# Patient Record
Sex: Female | Born: 1975 | Race: White | Hispanic: No | Marital: Married | Smoking: Never smoker
Health system: Southern US, Community
[De-identification: ages and names within clinical notes are randomized; demographics above are authoritative.]

## PROBLEM LIST (undated history)

## (undated) HISTORY — PX: ABDOMINAL HYSTERECTOMY: SHX81

## (undated) HISTORY — PX: TONSILLECTOMY: SUR1361

---

## 2009-06-26 ENCOUNTER — Ambulatory Visit: Payer: Self-pay | Admitting: Family Medicine

## 2011-08-28 ENCOUNTER — Ambulatory Visit: Payer: Self-pay | Admitting: Internal Medicine

## 2013-04-20 ENCOUNTER — Emergency Department: Payer: Self-pay | Admitting: Emergency Medicine

## 2014-12-05 ENCOUNTER — Other Ambulatory Visit: Payer: Self-pay | Admitting: Family Medicine

## 2014-12-05 DIAGNOSIS — Z1231 Encounter for screening mammogram for malignant neoplasm of breast: Secondary | ICD-10-CM

## 2014-12-11 ENCOUNTER — Ambulatory Visit: Payer: Self-pay | Attending: Family Medicine

## 2015-02-06 ENCOUNTER — Ambulatory Visit
Admission: RE | Admit: 2015-02-06 | Discharge: 2015-02-06 | Disposition: A | Discharge status: Home / Self Care | Payer: 59 | Source: Ambulatory Visit | Attending: Family Medicine | Admitting: Family Medicine

## 2015-02-06 DIAGNOSIS — Z1231 Encounter for screening mammogram for malignant neoplasm of breast: Secondary | ICD-10-CM | POA: Insufficient documentation

## 2015-06-03 ENCOUNTER — Ambulatory Visit (INDEPENDENT_AMBULATORY_CARE_PROVIDER_SITE_OTHER): Payer: 59

## 2015-06-03 ENCOUNTER — Telehealth: Payer: Self-pay | Admitting: *Deleted

## 2015-06-03 ENCOUNTER — Ambulatory Visit: Payer: 59

## 2015-06-03 ENCOUNTER — Ambulatory Visit (INDEPENDENT_AMBULATORY_CARE_PROVIDER_SITE_OTHER): Payer: 59 | Admitting: Podiatry

## 2015-06-03 VITALS — BP 118/77 | HR 85 | Resp 18

## 2015-06-03 DIAGNOSIS — R52 Pain, unspecified: Secondary | ICD-10-CM

## 2015-06-03 DIAGNOSIS — T148 Other injury of unspecified body region: Secondary | ICD-10-CM | POA: Diagnosis not present

## 2015-06-03 DIAGNOSIS — IMO0002 Reserved for concepts with insufficient information to code with codable children: Secondary | ICD-10-CM

## 2015-06-03 MED ORDER — DICLOFENAC SODIUM 1 % TD GEL: 2.0000 g | Freq: Four times a day (QID) | TRANSDERMAL | Status: DC

## 2015-06-03 MED ORDER — MELOXICAM 15 MG PO TABS: 15.0000 mg | ORAL_TABLET | Freq: Every day | ORAL | Status: DC

## 2015-06-03 NOTE — Telephone Encounter (Signed)
Pt states seen in [facility] today, Dr. Ardelle Anton stated she should use surgical shoe with a pad, but pt stated could not wear at work.  Pt states she asked her supervisor and she said she could wear the surgical shoe.  Pt to go to [facility] office to get surgical shoe with offloading pad placement either by Dr. Ardelle Anton or Misty Stanley.

## 2015-06-03 NOTE — Progress Notes (Signed)
Subjective:     Patient ID: Tricia Whitaker, female   DOB: [birth year], 40 y.o.   MRN: [id number]  HPI 40 year old female presents to the office today for concerns of right pain which has been going on for the last "couple of months". The pain on the right foot started as she started working out and doing the ellipitical and treadmill. She stopped and the pain got better for some time. She changed shoes and went back to the gym and the pain started again. She describes it as a sharp pain. The pain has started more recently on the left. She has tried a foot bath with epsom salts which helps quite a bit. The pain is continuous and worsens with walking. No injury or trauma. No other complaints today.   Review of Systems  All other systems reviewed and are negative.      Objective:   Physical Exam General: AAO x3, NAD  Dermatological: Skin is warm, dry and supple bilateral. Nails x 10 are well manicured; remaining integument appears unremarkable at this time. There are no open sores, no preulcerative lesions, no rash or signs of infection present.  Vascular: Dorsalis Pedis artery and Posterior Tibial artery pedal pulses are 2/4 bilateral with immedate capillary fill time. Pedal hair growth present. No varicosities and no lower extremity edema present bilateral. There is no pain with calf compression, swelling, warmth, erythema.   Neruologic: Grossly intact via light touch bilateral. Vibratory intact via tuning fork bilateral. Protective threshold with Semmes Wienstein monofilament intact to all pedal sites bilateral. Patellar and Achilles deep tendon reflexes 2+ bilateral. No Babinski or clonus noted bilateral.   Musculoskeletal: Tenderness to palpation along the medial sesamoid bilaterally. There is localized edema overlying the plantar aspect of the first MTPJ on the right foot. There is no erythema or increase in warmth. There is no other areas of tenderness to bilateral lower x-rays.  There is no pain with first MTPJ range of motion.  Gait: Unassisted, Nonantalgic.      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  -Recommended surgical shoe and offloading for the right foot however she cannot wear this at work. I made offloading pads applied and the inserts in her shoes. I discussed steroid injection to the right side as this is likely chronic at this point have her she wishes to hold off. -Prescribed mobic. Discussed side effects of the medication and directed to stop if any are to occur and call the office.  -Voltaren gel -Follow-up in 3 weeks or sooner if any problems arise. In the meantime, encouraged to call the office with any questions, concerns, change in symptoms.  *x-ray next appointment  Ovid Curd, DPM

## 2015-06-04 NOTE — Telephone Encounter (Signed)
i will dispense the surgical shoe when patient stops by. Misty Stanley

## 2015-06-17 NOTE — Telephone Encounter (Signed)
Patient came by and got a darco shoe.Tricia Whitaker

## 2015-06-24 ENCOUNTER — Ambulatory Visit: Payer: 59 | Admitting: Podiatry

## 2015-06-26 ENCOUNTER — Ambulatory Visit (INDEPENDENT_AMBULATORY_CARE_PROVIDER_SITE_OTHER): Payer: 59 | Admitting: Podiatry

## 2015-06-26 ENCOUNTER — Encounter: Payer: Self-pay | Admitting: Podiatry

## 2015-06-26 ENCOUNTER — Ambulatory Visit: Payer: 59

## 2015-06-26 ENCOUNTER — Ambulatory Visit (INDEPENDENT_AMBULATORY_CARE_PROVIDER_SITE_OTHER): Payer: 59

## 2015-06-26 VITALS — BP 98/64 | HR 89 | Resp 18

## 2015-06-26 DIAGNOSIS — R52 Pain, unspecified: Secondary | ICD-10-CM

## 2015-06-26 DIAGNOSIS — M779 Enthesopathy, unspecified: Secondary | ICD-10-CM

## 2015-06-26 DIAGNOSIS — T148 Other injury of unspecified body region: Secondary | ICD-10-CM

## 2015-06-26 DIAGNOSIS — IMO0002 Reserved for concepts with insufficient information to code with codable children: Secondary | ICD-10-CM

## 2015-06-26 MED ORDER — METHYLPREDNISOLONE 4 MG PO TBPK: ORAL_TABLET | ORAL | Status: DC

## 2015-06-27 NOTE — Progress Notes (Signed)
Patient ID: Tricia Whitaker, female   DOB: 08/03/1975, 40 y.o.   MRN: [id number]  Subjective: 40 year old female presents the office today for follow up evaluation of pain to both of her feet along the sesamoids. She states they feel that this and if it is less time. Anti-inflammatories do not help. The offloading pads, metatarsal pad, did not help much. No other complaints. Denies any systemic complaints such as fevers, chills, nausea, vomiting. No acute changes since last appointment, and no other complaints at this time.   Objective: AAO x3, NAD DP/PT pulses palpable bilaterally, CRT less than 3 seconds Protective sensation intact with Simms Weinstein monofilament There is continued tenderness palpation of the medial sesamoid bilaterally. There is slight edema along the sesamoid on the right side however there is no swelling or redness of the left side. There is no pain with MPJ range of motion. No other areas of tenderness. There doesn't be to be a slowly plantarflexed first metatarsal. No areas of pinpoint bony tenderness or pain with vibratory sensation. MMT 5/5, ROM WNL. No edema, erythema, increase in warmth to bilateral lower extremities.  No open lesions or pre-ulcerative lesions.  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  Plan: -All treatment options discussed with the patient including all alternatives, risks, complications.  -Given her continued pain discussed Medrol Dosepak. He is been ongoing for several months with continued pain. I doubly this is an acute fracture. She would proceed with steroid is ordered today.  -She likely benefit more from orthotics help support her foot and take pressure off the first metatarsal phalangeal joint and the sesamoids. Should proceed with orthotics today. [facility] -Recommended an  injection but she wishes to hold off. -Follow-up in 3 weeks to pick up orthotics or sooner if any issues are to arise.  -Patient encouraged to call the office with any questions, concerns, change in symptoms.   Ovid CurdMatthew Wagoner, DPM

## 2015-07-17 ENCOUNTER — Ambulatory Visit (INDEPENDENT_AMBULATORY_CARE_PROVIDER_SITE_OTHER): Payer: 59 | Admitting: Podiatry

## 2015-07-17 ENCOUNTER — Encounter: Payer: Self-pay | Admitting: Podiatry

## 2015-07-17 VITALS — BP 94/67 | HR 93 | Resp 18

## 2015-07-17 DIAGNOSIS — M779 Enthesopathy, unspecified: Secondary | ICD-10-CM | POA: Diagnosis not present

## 2015-07-17 DIAGNOSIS — M2580 Other specified joint disorders, unspecified joint: Secondary | ICD-10-CM | POA: Diagnosis not present

## 2015-07-17 NOTE — Progress Notes (Signed)
Patient ID: Tricia Whitaker, female   DOB: 11/16/1975, 40 y.o.   MRN: [id number]  Subjective: 40 year old female presents the office today for follow up evaluation of pain to both of her feet along the sesamoids. She states that since doing the steroid she's had significant improvement in symptoms. She had some occasional discomfort but she is doing better. She presents to pick up orthotics. No recent swelling or redness. Denies any systemic complaints such as fevers, chills, nausea, vomiting. No acute changes since last appointment, and no other complaints at this time.   Objective: AAO x3, NAD DP/PT pulses palpable bilaterally, CRT less than 3 seconds This greatly improved tenderness palpation along the course of the medial sesamoids bilaterally. There is some smile discomfort on the right side however the patient left side has resolved. The swelling on the right side is also decreased. There is no erythema. There is no swelling or redness of the left side. No other areas of tenderness. There is no pain with first MTPJ range of motion. No areas of pinpoint bony tenderness or pain with vibratory sensation. MMT 5/5, ROM WNL. No edema, erythema, increase in warmth to bilateral lower extremities.  No open lesions or pre-ulcerative lesions.  No pain with calf compression, swelling, warmth, erythema  Assessment: Resolving sesamoiditis.  Plan: -All treatment options discussed with the patient including all alternatives, risks, complications.  -At this injection but she wishes to hold off. Orthotics were tried on today however the cut out the sesamoids stops right at with the sesamoids or at impression the sesamoids so. I will send orthotics back to have them modified. Continue with supportive shoe gear. Anti-inflammatories as needed. We will call her once the orthotics arrive. Encouraged to call sooner any problems or any questions or concerns.  Ovid CurdMatthew Lola Lofaro, DPM

## 2015-08-13 ENCOUNTER — Telehealth: Payer: Self-pay | Admitting: *Deleted

## 2015-08-13 NOTE — Telephone Encounter (Signed)
CALLED PATIENT AND LEFT MESSAGE FOR THE PATIENT TO COME AND PICK UP INSERTS (REFURBISHED) IN THE [place] OFFICE. Tricia Whitaker

## 2015-12-13 ENCOUNTER — Ambulatory Visit (INDEPENDENT_AMBULATORY_CARE_PROVIDER_SITE_OTHER)
Admission: EM | Admit: 2015-12-13 | Discharge: 2015-12-13 | Disposition: A | Discharge status: Other Acute Inpatient Hospital | Payer: 59 | Source: Home / Self Care | Attending: Family Medicine | Admitting: Family Medicine

## 2015-12-13 ENCOUNTER — Emergency Department
Admission: EM | Admit: 2015-12-13 | Discharge: 2015-12-13 | Disposition: A | Discharge status: Home / Self Care | Payer: 59 | Attending: Emergency Medicine | Admitting: Emergency Medicine

## 2015-12-13 ENCOUNTER — Emergency Department: Payer: 59

## 2015-12-13 ENCOUNTER — Encounter: Payer: Self-pay | Admitting: *Deleted

## 2015-12-13 ENCOUNTER — Encounter: Payer: Self-pay | Admitting: Emergency Medicine

## 2015-12-13 DIAGNOSIS — R1084 Generalized abdominal pain: Secondary | ICD-10-CM | POA: Diagnosis not present

## 2015-12-13 DIAGNOSIS — R51 Headache: Secondary | ICD-10-CM | POA: Diagnosis not present

## 2015-12-13 DIAGNOSIS — R1031 Right lower quadrant pain: Secondary | ICD-10-CM | POA: Insufficient documentation

## 2015-12-13 DIAGNOSIS — Z79899 Other long term (current) drug therapy: Secondary | ICD-10-CM | POA: Insufficient documentation

## 2015-12-13 DIAGNOSIS — R519 Headache, unspecified: Secondary | ICD-10-CM

## 2015-12-13 DIAGNOSIS — Z791 Long term (current) use of non-steroidal anti-inflammatories (NSAID): Secondary | ICD-10-CM | POA: Diagnosis not present

## 2015-12-13 LAB — COMPREHENSIVE METABOLIC PANEL WITH GFR
ALT: 24 U/L (ref 14–54)
AST: 24 U/L (ref 15–41)
Albumin: 4.3 g/dL (ref 3.5–5.0)
Alkaline Phosphatase: 37 U/L — ABNORMAL LOW (ref 38–126)
Anion gap: 8 (ref 5–15)
BUN: 13 mg/dL (ref 6–20)
CO2: 24 mmol/L (ref 22–32)
Calcium: 8.7 mg/dL — ABNORMAL LOW (ref 8.9–10.3)
Chloride: 104 mmol/L (ref 101–111)
Creatinine, Ser: 0.65 mg/dL (ref 0.44–1.00)
GFR calc Af Amer: >60 mL/min (ref >60)
GFR calc non Af Amer: >60 mL/min (ref >60)
Glucose, Bld: 99 mg/dL (ref 65–99)
Potassium: 3.5 mmol/L (ref 3.5–5.1)
Sodium: 136 mmol/L (ref 135–145)
Total Bilirubin: 0.6 mg/dL (ref 0.3–1.2)
Total Protein: 7.4 g/dL (ref 6.5–8.1)

## 2015-12-13 LAB — CBC
HCT: 45.1 % (ref 35.0–47.0)
Hemoglobin: 16 g/dL (ref 12.0–16.0)
MCH: 30.2 pg (ref 26.0–34.0)
MCHC: 35.4 g/dL (ref 32.0–36.0)
MCV: 85.4 fL (ref 80.0–100.0)
Platelets: 262 K/uL (ref 150–440)
RBC: 5.29 MIL/uL — ABNORMAL HIGH (ref 3.80–5.20)
RDW: 12.7 % (ref 11.5–14.5)
WBC: 8.1 K/uL (ref 3.6–11.0)

## 2015-12-13 LAB — URINALYSIS COMPLETE WITH MICROSCOPIC (ARMC ONLY)
Bilirubin Urine: NEGATIVE
Glucose, UA: NEGATIVE mg/dL
Ketones, ur: NEGATIVE mg/dL
LEUKOCYTES UA: NEGATIVE
Nitrite: NEGATIVE
PH: 5 (ref 5.0–8.0)
Protein, ur: NEGATIVE mg/dL
Specific Gravity, Urine: 1.012 (ref 1.005–1.030)

## 2015-12-13 LAB — LIPASE, BLOOD: Lipase: 23 U/L (ref 11–51)

## 2015-12-13 MED ORDER — IOPAMIDOL (ISOVUE-300) INJECTION 61%
100.0000 mL | Freq: Once | INTRAVENOUS | Status: AC | PRN
  Administered 2015-12-13: 100 mL via INTRAVENOUS

## 2015-12-13 MED ORDER — MORPHINE SULFATE (PF) 2 MG/ML IV SOLN
2.0000 mg | Freq: Once | INTRAVENOUS | Status: AC
  Administered 2015-12-13: 2 mg via INTRAVENOUS
  Filled 2015-12-13: qty 1

## 2015-12-13 MED ORDER — MORPHINE SULFATE (PF) 4 MG/ML IV SOLN
4.0000 mg | Freq: Once | INTRAVENOUS | Status: AC
  Administered 2015-12-13: 4 mg via INTRAVENOUS
  Filled 2015-12-13: qty 1

## 2015-12-13 MED ORDER — SODIUM CHLORIDE 0.9 % IV BOLUS (SEPSIS)
1000.0000 mL | Freq: Once | INTRAVENOUS | Status: AC
  Administered 2015-12-13: 1000 mL via INTRAVENOUS

## 2015-12-13 MED ORDER — METOCLOPRAMIDE HCL 5 MG/ML IJ SOLN
10.0000 mg | Freq: Once | INTRAMUSCULAR | Status: AC
  Administered 2015-12-13: 10 mg via INTRAVENOUS
  Filled 2015-12-13: qty 2

## 2015-12-13 MED ORDER — IOPAMIDOL (ISOVUE-300) INJECTION 61%
30.0000 mL | Freq: Once | INTRAVENOUS | Status: AC | PRN
  Administered 2015-12-13: 30 mL via ORAL

## 2015-12-13 NOTE — ED Provider Notes (Addendum)
[facility] Emergency Department Provider Note   ____________________________________________   First MD Initiated Contact with Patient 12/13/15 1133     (approximate)  I have reviewed the triage vital signs and the nursing notes.   HISTORY  Chief Complaint Abdominal Pain    HPI Tricia Whitaker is a 40 y.o. female reports she is been having abdominal pain for the last 2 days, started became fairly severe after eating yesterday. She reports an ongoing pain centered over the right mid to right lower abdomen, but occasionally feels "all over". She had some mild chills yesterday.  No vomiting, no nausea. No diarrhea. No black or bloody stool.  Patient reports that she's also been having an ongoing headache which is been going on for the last 6 weeks, seen and evaluated by neurology who started her on steroid medication recently. She had been taking BC powder daily prior to that, but stopped taking this 2 days ago.  Denies previous abdominal surgery except for hysterectomy.   History reviewed. No pertinent past medical history.  Patient Active Problem List   Diagnosis Date Noted  . 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 ABDOMINAL HYSTERECTOMY    . TONSILLECTOMY      Prior to Admission medications   Medication Sig Start Date End Date Taking? Authorizing Provider  clonazePAM (KLONOPIN) 0.5 MG tablet TAKE 1 TABLET BY MOUTH 2 TIMES A DAY AS NEEDED 03/17/15   Historical Provider, MD  diclofenac sodium (VOLTAREN) 1 % GEL Apply 2 g topically 4 (four) times daily. Rub into affected area of foot 2 to 4 times daily 06/03/15   Vivi BarrackMatthew R Wagoner, DPM  escitalopram (LEXAPRO) 20 MG tablet Take by mouth. 03/13/15 06/11/15  Historical Provider, MD  meloxicam (MOBIC) 15 MG tablet Take 1 tablet (15 mg total) by mouth daily. 06/03/15   Vivi BarrackMatthew R Wagoner, DPM  methylPREDNISolone (MEDROL DOSEPAK) 4 MG TBPK tablet Take as  directed 06/26/15   Vivi BarrackMatthew R Wagoner, DPM  phentermine (ADIPEX-P) 37.5 MG tablet Take 37.5 mg by mouth every morning. 03/17/15   Historical Provider, MD  Vitamin D, Ergocalciferol, (DRISDOL) 50000 units CAPS capsule  03/21/14   Historical Provider, MD    Allergies Nicoderm [nicotine]  Family History  Problem Relation Age of Onset  . Breast cancer Mother     Social History Social History  Substance Use Topics  . Smoking status: Never Smoker  . Smokeless tobacco: Never Used  . Alcohol use 0.0 oz/week     Comment: not often    Review of Systems Constitutional: No fever/chills Eyes: No visual changes. ENT: No sore throat.No stiff or sore neck. Cardiovascular: Denies chest pain. Respiratory: Denies shortness of breath. Gastrointestinal:  No nausea, no vomiting.  No diarrhea.  No constipation. Genitourinary: Negative for dysuria. Musculoskeletal: Negative for back pain. Skin: Negative for rash. Neurological: Negative for focal weakness or numbness.Reports a pounding bifrontal headache off and on that almost every day for the last 6 weeks.  10-point ROS otherwise negative.  ____________________________________________   PHYSICAL EXAM:  VITAL SIGNS: ED Triage Vitals  Enc Vitals Group     BP 12/13/15 1131 (!) 119/104     Pulse Rate 12/13/15 1131 92     Resp 12/13/15 1131 15     Temp 12/13/15 1131 98.3 F (36.8 C)     Temp Source 12/13/15 1131 Oral     SpO2 12/13/15 1131 98 %     Weight 12/13/15  1120 195 lb (88.5 kg)     Height 12/13/15 1120 5\' 3"  (1.6 m)     Head Circumference --      Peak Flow --      Pain Score 12/13/15 1120 7     Pain Loc --      Pain Edu? --      Excl. in GC? --     Constitutional: Alert and oriented. Well appearing and in no acute distress. Eyes: Conjunctivae are normal. PERRL. EOMI.No photophobia Head: Atraumatic. Nose: No congestion/rhinnorhea. No meningismus Mouth/Throat: Mucous membranes are moist.  Oropharynx non-erythematous. Neck:  No stridor.   Cardiovascular: Normal rate, regular rhythm. Grossly normal heart sounds.  Good peripheral circulation. Respiratory: Normal respiratory effort.  No retractions. Lungs CTAB. Gastrointestinal: Soft and nontender except for moderate discomfort in the right mid to right lower abdomen. Negative Murphy. No rebound or guarding. No distention. No abdominal bruits. No CVA tenderness. Musculoskeletal: No lower extremity tenderness nor edema. Neurologic:  Normal speech and language. No gross focal neurologic deficits are appreciated. Skin:  Skin is warm, dry and intact. No rash noted. No pronator drift. Normal strength in all extremities with normal sensation. No photophobia. Normal extraocular movements. Normal cranial nerve exam Psychiatric: Mood and affect are normal. Speech and behavior are normal.  ____________________________________________   LABS (all labs ordered are listed, but only abnormal results are displayed)  Labs Reviewed  COMPREHENSIVE METABOLIC PANEL - Abnormal; Notable for the following:       Result Value   Calcium 8.7 (*)    Alkaline Phosphatase 37 (*)    All other components within normal limits  CBC - Abnormal; Notable for the following:    RBC 5.29 (*)    All other components within normal limits  URINALYSIS COMPLETEWITH MICROSCOPIC ([facility] ONLY) - Abnormal; Notable for the following:    Color, Urine YELLOW (*)    APPearance CLEAR (*)    Hgb urine dipstick 1+ (*)    Bacteria, UA RARE (*)    Squamous Epithelial / LPF 0-5 (*)    All other components within normal limits  LIPASE, BLOOD   ____________________________________________  EKG  ED ECG REPORT I, Crayton Savarese, the attending physician, personally viewed and interpreted this ECG.  Date: 12/13/2015 EKG Time: 11:30 Rate: 86 Rhythm: normal sinus rhythm QRS Axis: normal Intervals: normal ST/T Wave abnormalities: normal Conduction Disturbances: none Narrative Interpretation:  unremarkable  ____________________________________________  RADIOLOGY  Discussed the risks and benefits of CT of the head due to the patient's ongoing headaches, and in discussion she reports that she would not wish for a CT scan as she has a history of the same headaches off and on for a long time and she reports she believes she's had a previous CT or an MRI many years ago for the same reason. I think this is reasonable, her primary reason for ER evaluation today appears to be due to her abdominal pain and patient has had recent follow-up and continues to follow closely with neurology with no sign of acute neurologic abnormality or deficit.  Ct Abdomen Pelvis W Contrast  Result Date: 12/13/2015 CLINICAL DATA:  Right lower quadrant abdomen pain for 2 days. EXAM: CT ABDOMEN AND PELVIS WITH CONTRAST TECHNIQUE: Multidetector CT imaging of the abdomen and pelvis was performed using the standard protocol following bolus administration of intravenous contrast. CONTRAST:  ISOVUE-300 IOPAMIDOL (ISOVUE-300) INJECTION 61% COMPARISON:  None. FINDINGS: Lower chest:  There is mild atelectasis of bilateral lung bases. Hepatobiliary: The  liver and gallbladder are normal. No masses or other significant abnormality. Pancreas: No mass, inflammatory changes, or other significant abnormality. Spleen: Within normal limits in size and appearance. Adrenals/Urinary Tract: The adrenal glands and kidneys are normal. The bladder is normal. No masses identified. No evidence of hydronephrosis. Stomach/Bowel: No evidence of obstruction, inflammatory process, or abnormal fluid collections. The appendix is normal. Moderate bowel content is identified throughout colon. Vascular/Lymphatic: No pathologically enlarged lymph nodes. No evidence of abdominal aortic aneurysm. Reproductive: No mass or other significant abnormality. Small ovarian cysts are identified bilaterally in normal size bilateral ovaries. Patient status post prior  hysterectomy. Other: There is umbilical herniation of mesenteric fat. Musculoskeletal: Minimal degenerative joint changes of the spine are noted. IMPRESSION: No acute abnormality identified in the abdomen pelvis. The appendix is normal. Electronically Signed   By: Sherian Rein M.D.   On: 12/13/2015 13:54    ____________________________________________   PROCEDURES  Procedure(s) performed: None  Procedures  Critical Care performed: No  ____________________________________________   INITIAL IMPRESSION / ASSESSMENT AND PLAN / ED COURSE  Pertinent labs & imaging results that were available during my care of the patient were reviewed by me and considered in my medical decision making (see chart for details).  Differential diagnosis includes but is not limited to, abdominal perforation, aortic dissection, cholecystitis, appendicitis, diverticulitis, colitis, esophagitis/gastritis, kidney stone, pyelonephritis, urinary tract infection, aortic aneurysm. All are considered in decision and treatment plan. Based upon the patient's presentation and risk factors, we'll proceed with CT to evaluate for etiology such as possible appendicitis, cholecystitis. However, based on the patient's symptomatology and recent use of BC powder/NSAIDs would also consider the possibility of gastritis or ulcerative disease which CT may not evaluate well for.  ----------------------------------------- 2:11 PM on 12/13/2015 -----------------------------------------  Patient reports her headache has resolved. She feels much better. She did have a mild irritation and erythema at the site of morphine injection, but this is improved. I discussed with her and will place her on Zantac twice daily, and advised close follow-up with her primary care doctor and careful abdominal pain precautions with patient and her husband are agreeable with. Family will be driving her home.  Return precautions and treatment recommendations and  follow-up discussed with the patient who is agreeable with the plan.  Clinical Course     ____________________________________________   FINAL CLINICAL IMPRESSION(S) / ED DIAGNOSES  Final diagnoses:  Recurrent headache  Abdominal pain of unknown cause, right      NEW MEDICATIONS STARTED DURING THIS VISIT:  New Prescriptions   No medications on file     Note:  This document was prepared using Dragon voice recognition software and may include unintentional dictation errors.     Sharyn Creamer, MD 12/13/15 1413    Sharyn Creamer, MD 12/13/15 [phone]

## 2015-12-13 NOTE — ED Notes (Signed)
Returned from CT.

## 2015-12-13 NOTE — Discharge Instructions (Signed)
You were seen in the emergency room for abdominal pain. It is important that you follow up closely with your primary care doctor in the next couple of days.  I recommend starting Zantac (over-the-counter) for the next 2 weeks.  Please return to the emergency room right away if you are to develop a fever, severe nausea, your pain becomes severe or worsens, you are unable to keep food down, begin vomiting any dark or bloody fluid, you develop any dark or bloody stools, feel dehydrated, or other new concerns or symptoms arise.

## 2015-12-13 NOTE — ED Triage Notes (Signed)
[facility]  Patient is complaining of generalized abdominal pain since Thursday and a headache x 6 weeks.  Patient states pain is "gnawing, with occasional sharp pains".  Patient ambulatory to triage but appears somewhat uncomfortable.  Patient states abdomen is very tender.  Patient denies nausea, vomiting and diarrhea.

## 2015-12-13 NOTE — Discharge Instructions (Signed)
Go directly to emergency room as discussed.  °

## 2015-12-13 NOTE — ED Triage Notes (Signed)
Onset of headache, abd pain, chills, and body aches Thursday. Pt states chills resolved but headache and abd pain have persisted.

## 2015-12-13 NOTE — ED Provider Notes (Signed)
[facility] ____________________________________________  Time seen: Approximately 0950 AM  I have reviewed the triage vital signs and the nursing notes.   HISTORY  Chief Complaint Abdominal Pain; Migraine; Generalized Body Aches; and Chills   HPI Tricia Whitaker is a 40 y.o. female presents for multiple medical complaints. Patient reports her biggest reason, today is that she has had abdominal pain for the last 3 days. Patient reports she has a constant generalized abdominal pain with intermittent sharp stabbing pains in different areas of her stomach. Patient states the sharp stabbing pains comes and goes throughout the day and denies known triggers. Patient does report that food worsens her pain but states pain patterns persist even without food. Does report associated nausea, denies vomiting. Denies diarrhea or constipation. Denies any blood in stool, dark color stool or abnormal bleeding.  Patient also reports that she has had headaches for the last 5 weeks. Patient reports the headaches to the front of her head and  Come and go throughout day and described as a throbbing sensation. Patient reports she was initially seen by her primary care physician and started on Midrin and Topamax. Patient reports in addition to the Midrin she was taking BC powders or Goody powders every 4 hours. Patient reports she then was referred to neurology and she saw neurology 3 days ago. Patient reports neurologist then informed her to stop taking all other medications including the over-the-counter medications and started her on what she refers to as a steroid. Patient reports that this steroid the only medication that she is currently taking. Patient states that her headaches may have slightly improved with the steroids, but have continued. Denies resolution of the headaches and denies worsening. Patient reports that she was told by the neurologist that her headaches may be caused by taking  too many medications over-the-counter and prescription medicines. Patient reports she has not been taking any medications other than steroid.  Denies chest pain, shortness of breath, neck pain, back pain, dysuria, abnormal bleeding, vaginal complaints, extremity pain, extremity swelling, paresthesias, numbness or tingling sensation, extremity weakness, dizziness, vision changes. Denies known fevers. Reports has continued to drink fluids well but reports a decreased appetite second to the abdominal pain. Denies recent sickness. Denies recent antibiotic use. Denies any other medication changes.  Rolm GalaGRANDIS, HEIDI, MD PCP No LMP recorded. Patient has had a hysterectomy.    History reviewed. No pertinent past medical history.  Patient Active Problem List   Diagnosis Date Noted  . 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 ABDOMINAL HYSTERECTOMY    . TONSILLECTOMY      No current facility-administered medications for this encounter.   Current Outpatient Prescriptions:  ."steroid"  Allergies Nicoderm [nicotine]  Family History  Problem Relation Age of Onset  . Breast cancer Mother     Social History Social History  Substance Use Topics  . Smoking status: Never Smoker  . Smokeless tobacco: Never Used  . Alcohol use 0.0 oz/week     Comment: not often    Review of Systems Constitutional: No fever/chills Eyes: No visual changes. ENT: No sore throat. Cardiovascular: Denies chest pain. Respiratory: Denies shortness of breath. Gastrointestinal: As above. no vomiting.  No diarrhea.  No constipation. Genitourinary: Negative for dysuria. Musculoskeletal: Negative for back pain. Skin: Negative for rash. Neurological: Negative for headaches, focal weakness or numbness.  10-point ROS otherwise negative.  ____________________________________________   PHYSICAL EXAM:  VITAL SIGNS: ED Triage Vitals  Enc  Vitals Group     BP 12/13/15 0908  119/71     Pulse Rate 12/13/15 0908 (!) 101     Resp 12/13/15 0908 16     Temp 12/13/15 0908 98.2 F (36.8 C)     Temp Source 12/13/15 0908 Oral     SpO2 12/13/15 0908 100 %     Weight 12/13/15 0909 195 lb (88.5 kg)     Height 12/13/15 0909 5\' 3"  (1.6 m)     Head Circumference --      Peak Flow --      Pain Score 12/13/15 0913 10     Pain Loc --      Pain Edu? --      Excl. in GC? --    Constitutional: Alert and oriented. Well appearing and in no acute distress. Eyes: Conjunctivae are normal. PERRL. EOMI. No pain with EOMs.  Head: Atraumatic. No tenderness over temporal arteries. No sinus TTP. No tenderness to palpation.   Ears: no erythema, normal TMs bilaterally.   Nose: No congestion/rhinnorhea.  Mouth/Throat: Mucous membranes are moist.  Oropharynx non-erythematous. Neck: No stridor.  No cervical spine tenderness to palpation. Hematological/Lymphatic/Immunilogical: No cervical lymphadenopathy. Cardiovascular: Normal rate, regular rhythm. Grossly normal heart sounds.  Good peripheral circulation. Respiratory: Normal respiratory effort.  No retractions. Lungs CTAB. No wheezes, rales or rhonchi. Gastrointestinal: Diffuse abdominal tenderness noted, moderate right lower quadrant and moderate epigastric tenderness. No abdominal guarding. Normal Bowel sounds.  No abdominal bruits. No CVA tenderness. Musculoskeletal: No lower or upper extremity tenderness nor edema.Bilateral pedal pulses equal and easily palpated. No cervical, thoracic or lumbar tenderness to palpation..  Neurologic:  Normal speech and language. No gross focal neurologic deficits are appreciated. No gait instability. No ataxia, normal finger to nose. Negative Romberg. 5/5 strength to bilateral upper and lower extremities. Normal finger to nose.. No meningismus.   Skin:  Skin is warm, dry and intact. No rash noted. Psychiatric: Mood and affect are normal. Speech and behavior are  normal.  ___________________________________________   LABS (all labs ordered are listed, but only abnormal results are displayed)  Labs Reviewed - No data to display   PROCEDURES Procedures     INITIAL IMPRESSION / ASSESSMENT AND PLAN / ED COURSE  Pertinent labs & imaging results that were available during my care of the patient were reviewed by me and considered in my medical decision making (see chart for details).  Patient presenting for the complaints of continued headaches for last several weeks not resolved after seeing neurology. Patient also reports the last few days complaining of abdominal pain. Patient with moderate epigastric and moderate right lower quadrant abdominal pain on exam with diffuse surrounding tenderness. Patient denies any blood in stool, dark-colored stool or abnormal bleeding. Denies fall or trauma. Patient does admit to having recently having taken a lot of over-the-counter medications including NSAIDs prior to seeing a neurologist a few days ago. Discussed in detail with patient concern for gastrointestinal ulcerative pains, as well as concerned due to generalized tenderness and continued headaches. Discuss in detail with patient at this time and recommended for patient to be further evaluated emergency room of her choice at this time. Patient alert and oriented with decisional capacity and states she does not want to go by EMS but will take herself. Patient reports she will go directly to [facility].Melissa CMA called report to [facility]. Patient stable at the time of discharge and transfer.   Discussed follow up with Primary care physician this  week. Discussed follow up and return parameters including no resolution or any worsening concerns. Patient verbalized understanding and agreed to plan.   ____________________________________________   FINAL CLINICAL IMPRESSION(S) / ED DIAGNOSES  Final diagnoses:  Generalized abdominal pain  Acute  intractable headache, unspecified headache type     Discharge Medication List as of 12/13/2015 10:01 AM      Note: This dictation was prepared with Dragon dictation along with smaller phrase technology. Any transcriptional errors that result from this process are unintentional.    Clinical Course      Renford Dills, NP 12/13/15 1147

## 2016-01-06 ENCOUNTER — Other Ambulatory Visit: Payer: Self-pay | Admitting: Family Medicine

## 2016-01-06 DIAGNOSIS — Z1231 Encounter for screening mammogram for malignant neoplasm of breast: Secondary | ICD-10-CM

## 2016-02-09 ENCOUNTER — Other Ambulatory Visit: Payer: Self-pay | Admitting: Family Medicine

## 2016-02-09 ENCOUNTER — Ambulatory Visit
Admission: RE | Admit: 2016-02-09 | Discharge: 2016-02-09 | Disposition: A | Discharge status: Home / Self Care | Payer: 59 | Source: Ambulatory Visit | Attending: Family Medicine | Admitting: Family Medicine

## 2016-02-09 DIAGNOSIS — Z1231 Encounter for screening mammogram for malignant neoplasm of breast: Secondary | ICD-10-CM

## 2016-02-09 DIAGNOSIS — R928 Other abnormal and inconclusive findings on diagnostic imaging of breast: Secondary | ICD-10-CM | POA: Diagnosis not present

## 2016-02-13 ENCOUNTER — Other Ambulatory Visit: Payer: Self-pay | Admitting: Family Medicine

## 2016-02-13 DIAGNOSIS — N6320 Unspecified lump in the left breast, unspecified quadrant: Secondary | ICD-10-CM

## 2016-02-19 ENCOUNTER — Ambulatory Visit
Admission: RE | Admit: 2016-02-19 | Discharge: 2016-02-19 | Disposition: A | Discharge status: Home / Self Care | Payer: 59 | Source: Ambulatory Visit | Attending: Family Medicine | Admitting: Family Medicine

## 2016-02-19 DIAGNOSIS — N6320 Unspecified lump in the left breast, unspecified quadrant: Secondary | ICD-10-CM

## 2017-01-25 ENCOUNTER — Other Ambulatory Visit: Payer: Self-pay | Admitting: Family Medicine

## 2017-01-25 DIAGNOSIS — Z1231 Encounter for screening mammogram for malignant neoplasm of breast: Secondary | ICD-10-CM

## 2017-02-15 ENCOUNTER — Ambulatory Visit
Admission: RE | Admit: 2017-02-15 | Discharge: 2017-02-15 | Disposition: A | Discharge status: Home / Self Care | Payer: 59 | Source: Ambulatory Visit | Attending: Family Medicine | Admitting: Family Medicine

## 2017-02-15 DIAGNOSIS — Z1231 Encounter for screening mammogram for malignant neoplasm of breast: Secondary | ICD-10-CM | POA: Diagnosis present

## 2017-08-02 IMAGING — MG MM DIGITAL DIAGNOSTIC UNILAT*L* W/ TOMO W/ CAD
8 of 15 series · 8 of 35 positions shown · non-contrast
Comparison: Previous exam(s).

CLINICAL DATA: The patient was called back for a left breast
asymmetry

EXAM:
2D DIGITAL DIAGNOSTIC UNILATERAL LEFT MAMMOGRAM WITH CAD AND ADJUNCT
TOMO

[L CC (1 of 2)]
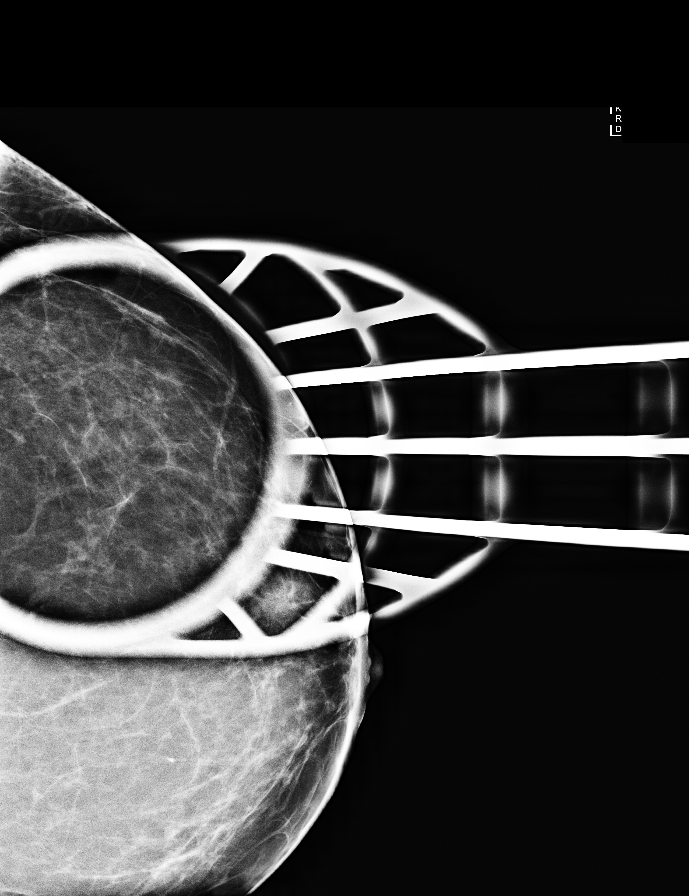

[L MLO]
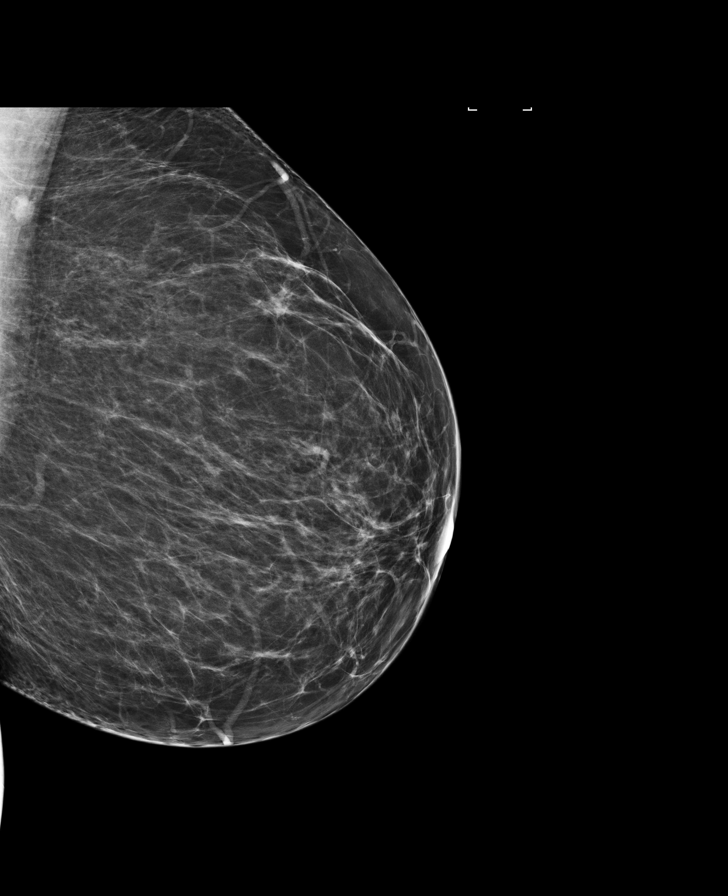

[L ML]
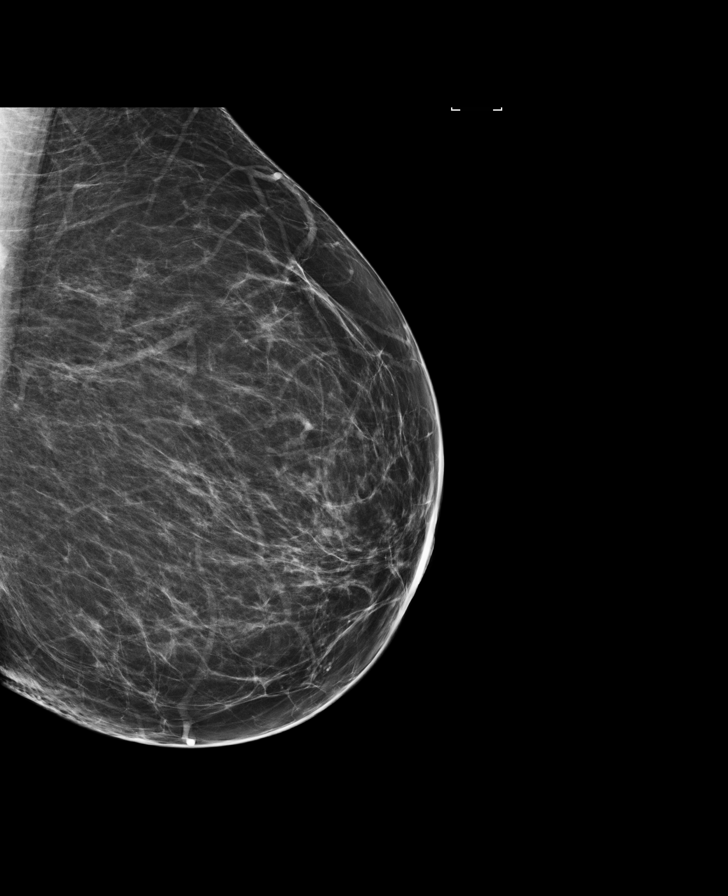

[L MLO synth-2D]
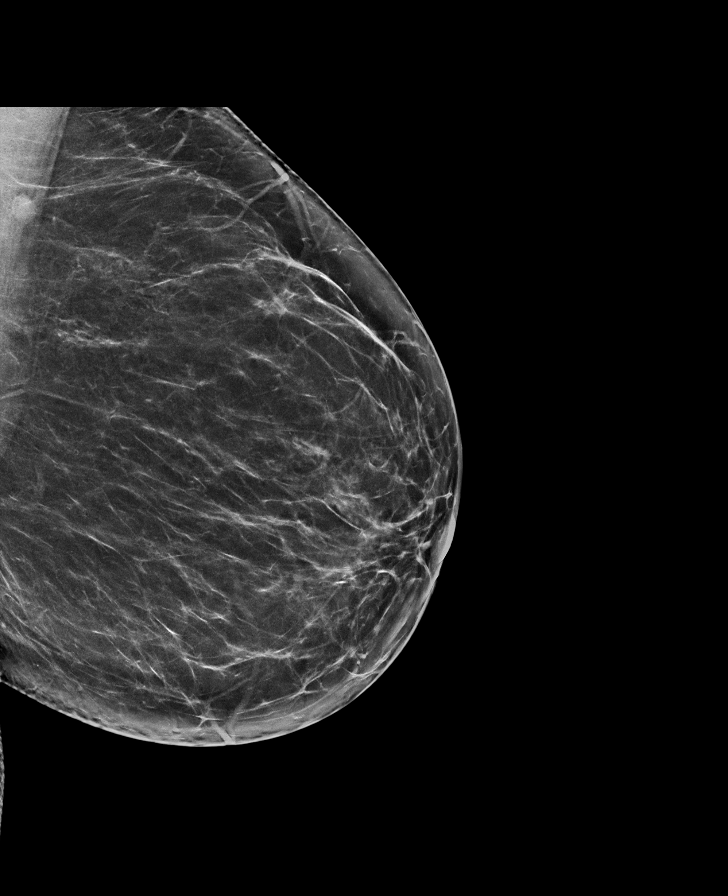

[L CC synth-2D (1 of 2)]
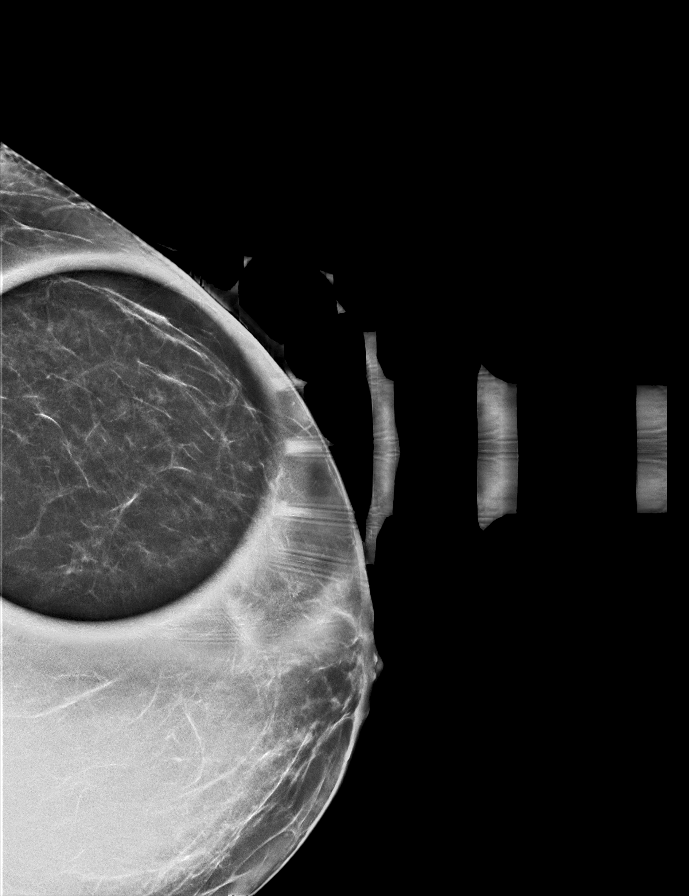

[L CC synth-2D (2 of 2)]
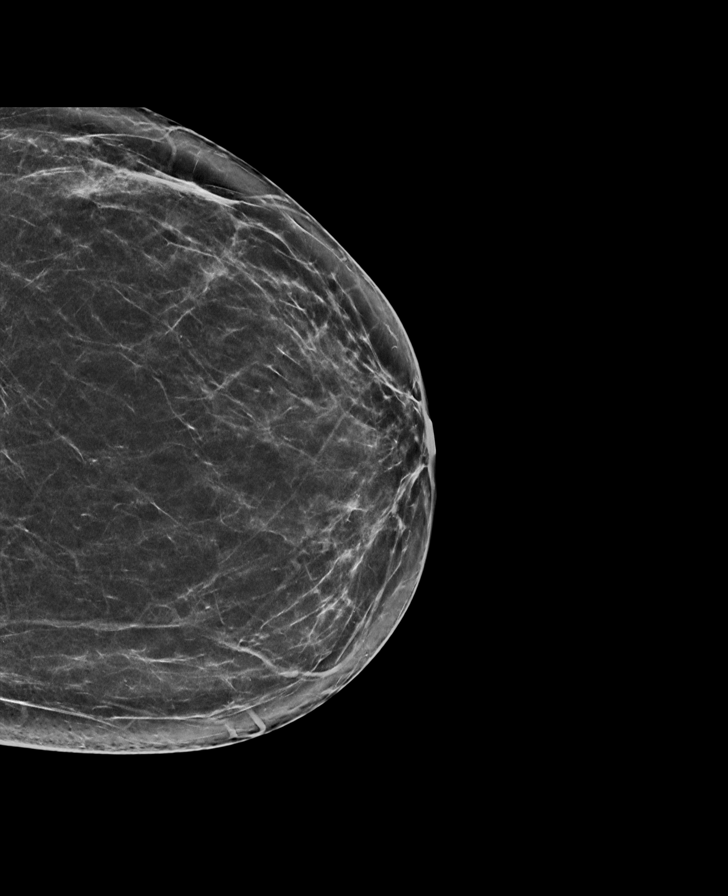

[L CC (2 of 2)]
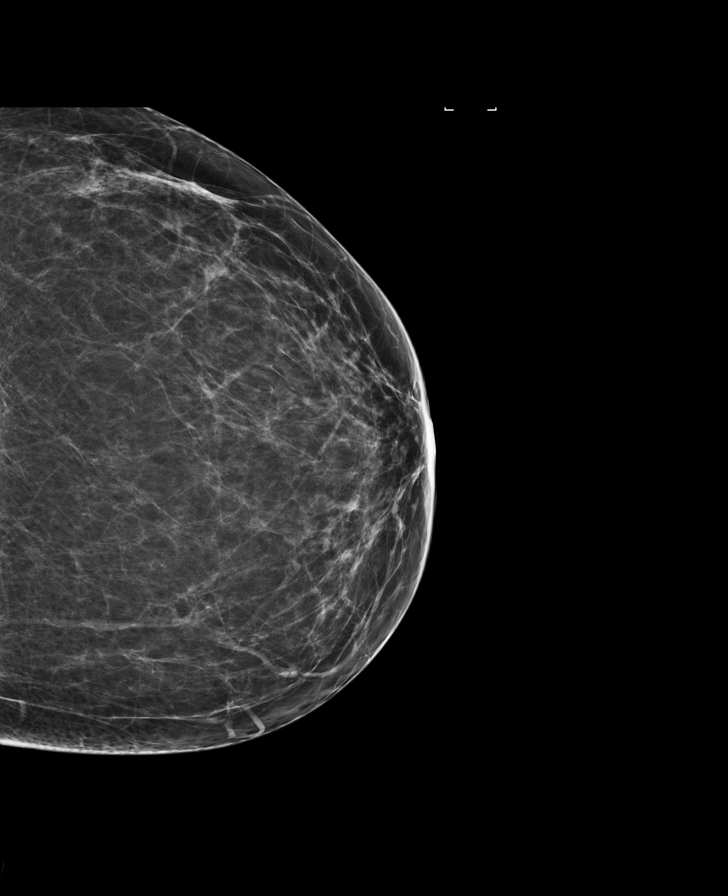

[L ML synth-2D]
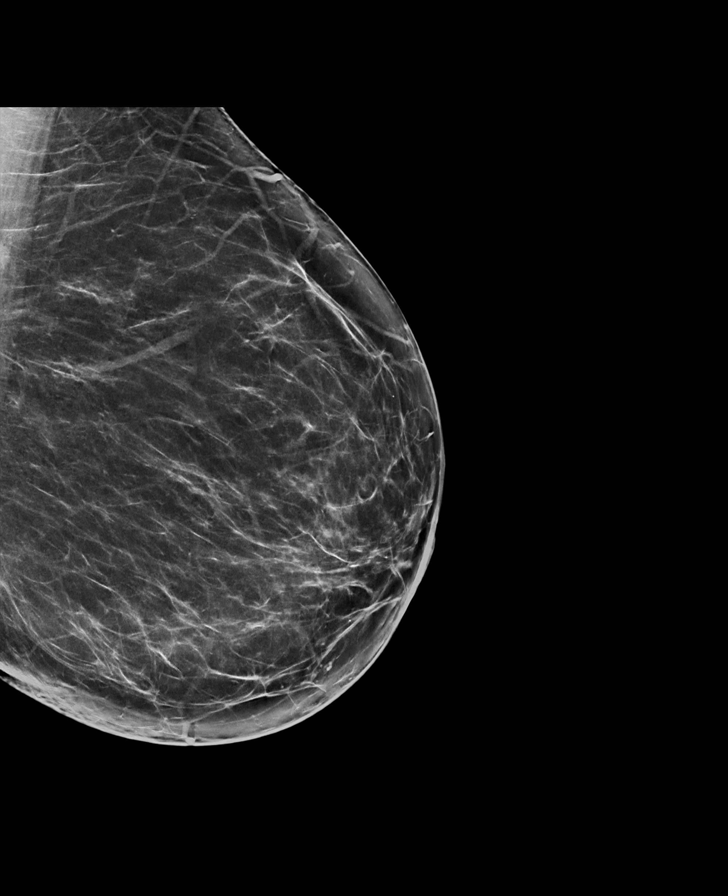

[8 of 35 positions shown; findings below may reference images not displayed]

ACR Breast Density Category b: There are scattered areas of
fibroglandular density.
FINDINGS: The left breast asymmetry resolves on additional imaging.

Mammographic images were processed with CAD.
IMPRESSION: No mammographic evidence of malignancy in the left breast.

RECOMMENDATION:
Annual screening mammography.

I have discussed the findings and recommendations with the patient.
Results were also provided in writing at the conclusion of the
visit. If applicable, a reminder letter will be sent to the patient
regarding the next appointment.

BI-RADS CATEGORY  2: Benign.

## 2018-04-25 ENCOUNTER — Other Ambulatory Visit: Payer: Self-pay | Admitting: Family Medicine

## 2018-04-25 DIAGNOSIS — Z1231 Encounter for screening mammogram for malignant neoplasm of breast: Secondary | ICD-10-CM

## 2018-04-28 ENCOUNTER — Ambulatory Visit
Admission: RE | Admit: 2018-04-28 | Discharge: 2018-04-28 | Disposition: A | Discharge status: Home / Self Care | Payer: Managed Care, Other (non HMO) | Source: Ambulatory Visit | Attending: Family Medicine | Admitting: Family Medicine

## 2018-04-28 DIAGNOSIS — Z1231 Encounter for screening mammogram for malignant neoplasm of breast: Secondary | ICD-10-CM | POA: Diagnosis not present

## 2021-02-18 ENCOUNTER — Other Ambulatory Visit: Payer: Self-pay | Admitting: Emergency Medicine

## 2021-02-18 DIAGNOSIS — N63 Unspecified lump in unspecified breast: Secondary | ICD-10-CM

## 2021-02-23 ENCOUNTER — Other Ambulatory Visit: Payer: Self-pay | Admitting: Physician Assistant

## 2021-02-23 DIAGNOSIS — N63 Unspecified lump in unspecified breast: Secondary | ICD-10-CM

## 2021-03-09 ENCOUNTER — Other Ambulatory Visit: Payer: Self-pay

## 2021-03-09 ENCOUNTER — Ambulatory Visit
Admission: RE | Admit: 2021-03-09 | Discharge: 2021-03-09 | Disposition: A | Discharge status: Home / Self Care | Payer: Managed Care, Other (non HMO) | Source: Ambulatory Visit | Attending: Physician Assistant | Admitting: Physician Assistant

## 2021-03-09 DIAGNOSIS — N63 Unspecified lump in unspecified breast: Secondary | ICD-10-CM | POA: Diagnosis present

## 2021-06-25 ENCOUNTER — Emergency Department: Payer: Managed Care, Other (non HMO)

## 2021-06-25 ENCOUNTER — Emergency Department: Payer: Managed Care, Other (non HMO) | Admitting: Anesthesiology

## 2021-06-25 ENCOUNTER — Inpatient Hospital Stay: Admit: 2021-06-25 | Payer: Managed Care, Other (non HMO) | Admitting: Surgery

## 2021-06-25 ENCOUNTER — Encounter
Admission: EM | Disposition: A | Discharge status: Home / Self Care | Payer: Self-pay | Source: Home / Self Care | Attending: Student in an Organized Health Care Education/Training Program

## 2021-06-25 ENCOUNTER — Other Ambulatory Visit: Payer: Self-pay

## 2021-06-25 ENCOUNTER — Observation Stay
Admission: EM | Admit: 2021-06-25 | Discharge: 2021-06-25 | Disposition: A | Discharge status: Home / Self Care | Payer: Managed Care, Other (non HMO) | Attending: Surgery | Admitting: Surgery

## 2021-06-25 ENCOUNTER — Encounter: Payer: Self-pay | Admitting: Emergency Medicine

## 2021-06-25 DIAGNOSIS — K8012 Calculus of gallbladder with acute and chronic cholecystitis without obstruction: Secondary | ICD-10-CM | POA: Diagnosis not present

## 2021-06-25 DIAGNOSIS — Z20822 Contact with and (suspected) exposure to covid-19: Secondary | ICD-10-CM | POA: Diagnosis not present

## 2021-06-25 DIAGNOSIS — K8000 Calculus of gallbladder with acute cholecystitis without obstruction: Secondary | ICD-10-CM | POA: Diagnosis not present

## 2021-06-25 DIAGNOSIS — R1011 Right upper quadrant pain: Secondary | ICD-10-CM | POA: Diagnosis present

## 2021-06-25 LAB — COMPREHENSIVE METABOLIC PANEL
ALT: 18 U/L (ref 0–44)
AST: 22 U/L (ref 15–41)
Albumin: 4.6 g/dL (ref 3.5–5.0)
Alkaline Phosphatase: 42 U/L (ref 38–126)
Anion gap: 11 (ref 5–15)
BUN: 22 mg/dL — ABNORMAL HIGH (ref 6–20)
CO2: 27 mmol/L (ref 22–32)
Calcium: 9.6 mg/dL (ref 8.9–10.3)
Chloride: 99 mmol/L (ref 98–111)
Creatinine, Ser: 0.86 mg/dL (ref 0.44–1.00)
GFR, Estimated: >60 mL/min (ref >60)
Glucose, Bld: 137 mg/dL — ABNORMAL HIGH (ref 70–99)
Potassium: 3.9 mmol/L (ref 3.5–5.1)
Sodium: 137 mmol/L (ref 135–145)
Total Bilirubin: 0.5 mg/dL (ref 0.3–1.2)
Total Protein: 7.8 g/dL (ref 6.5–8.1)

## 2021-06-25 LAB — CBC WITH DIFFERENTIAL/PLATELET
Abs Immature Granulocytes: 0.06 10*3/uL (ref 0.00–0.07)
Basophils Absolute: 0.1 10*3/uL (ref 0.0–0.1)
Basophils Relative: 1 %
Eosinophils Absolute: 0.4 10*3/uL (ref 0.0–0.5)
Eosinophils Relative: 3 %
HCT: 45.5 % (ref 36.0–46.0)
Hemoglobin: 15.2 g/dL — ABNORMAL HIGH (ref 12.0–15.0)
Immature Granulocytes: 1 %
Lymphocytes Relative: 22 %
Lymphs Abs: 2.7 10*3/uL (ref 0.7–4.0)
MCH: 30 pg (ref 26.0–34.0)
MCHC: 33.4 g/dL (ref 30.0–36.0)
MCV: 89.7 fL (ref 80.0–100.0)
Monocytes Absolute: 0.6 10*3/uL (ref 0.1–1.0)
Monocytes Relative: 5 %
Neutro Abs: 8.3 10*3/uL — ABNORMAL HIGH (ref 1.7–7.7)
Neutrophils Relative %: 68 %
Platelets: 318 10*3/uL (ref 150–400)
RBC: 5.07 MIL/uL (ref 3.87–5.11)
RDW: 12.6 % (ref 11.5–15.5)
WBC: 12.2 10*3/uL — ABNORMAL HIGH (ref 4.0–10.5)
nRBC: 0 % (ref 0.0–0.2)

## 2021-06-25 LAB — RESP PANEL BY RT-PCR (FLU A&B, COVID) ARPGX2
Influenza A by PCR: NEGATIVE
Influenza B by PCR: NEGATIVE
SARS Coronavirus 2 by RT PCR: NEGATIVE

## 2021-06-25 LAB — LIPASE, BLOOD: Lipase: 34 U/L (ref 11–51)

## 2021-06-25 SURGERY — CHOLECYSTECTOMY, ROBOT-ASSISTED, LAPAROSCOPIC
Anesthesia: General | Site: Abdomen

## 2021-06-25 MED ORDER — INDOCYANINE GREEN 25 MG IV SOLR
2.5000 mg | Freq: Once | INTRAVENOUS | Status: AC
Start: 2021-06-25 — End: 2021-06-25
  Administered 2021-06-25: 2.5 mg via INTRAVENOUS
  Filled 2021-06-25: qty 1

## 2021-06-25 MED ORDER — BUPIVACAINE LIPOSOME 1.3 % IJ SUSP
INTRAMUSCULAR | Status: AC
  Filled 2021-06-25: qty 20

## 2021-06-25 MED ORDER — KETOROLAC TROMETHAMINE 30 MG/ML IJ SOLN
INTRAMUSCULAR | Status: DC | PRN
  Administered 2021-06-25: 30 mg via INTRAVENOUS

## 2021-06-25 MED ORDER — LIDOCAINE VISCOUS HCL 2 % MT SOLN
15.0000 mL | Freq: Once | OROMUCOSAL | Status: AC
  Administered 2021-06-25: 15 mL via ORAL
  Filled 2021-06-25: qty 15

## 2021-06-25 MED ORDER — ACETAMINOPHEN 10 MG/ML IV SOLN
INTRAVENOUS | Status: AC
Start: 2021-06-25 — End: ?
  Filled 2021-06-25: qty 100

## 2021-06-25 MED ORDER — ONDANSETRON 4 MG PO TBDP: 4.0000 mg | ORAL_TABLET | Freq: Four times a day (QID) | ORAL | Status: DC | PRN

## 2021-06-25 MED ORDER — METRONIDAZOLE 500 MG/100ML IV SOLN
500.0000 mg | Freq: Once | INTRAVENOUS | Status: DC
  Filled 2021-06-25: qty 100

## 2021-06-25 MED ORDER — FENTANYL CITRATE PF 50 MCG/ML IJ SOSY
50.0000 ug | PREFILLED_SYRINGE | Freq: Once | INTRAMUSCULAR | Status: AC
  Administered 2021-06-25: 50 ug via INTRAVENOUS
  Filled 2021-06-25: qty 1

## 2021-06-25 MED ORDER — FENTANYL CITRATE (PF) 100 MCG/2ML IJ SOLN
INTRAMUSCULAR | Status: AC
  Filled 2021-06-25: qty 2

## 2021-06-25 MED ORDER — FENTANYL CITRATE (PF) 100 MCG/2ML IJ SOLN
25.0000 ug | INTRAMUSCULAR | Status: AC | PRN
  Administered 2021-06-25 (×6): 25 ug via INTRAVENOUS

## 2021-06-25 MED ORDER — ONDANSETRON HCL 4 MG/2ML IJ SOLN
4.0000 mg | Freq: Once | INTRAMUSCULAR | Status: AC
  Administered 2021-06-25: 4 mg via INTRAVENOUS
  Filled 2021-06-25: qty 2

## 2021-06-25 MED ORDER — ACETAMINOPHEN 10 MG/ML IV SOLN
INTRAVENOUS | Status: DC | PRN
  Administered 2021-06-25: 1000 mg via INTRAVENOUS

## 2021-06-25 MED ORDER — LACTATED RINGERS IV SOLN: INTRAVENOUS | Status: DC

## 2021-06-25 MED ORDER — LACTATED RINGERS IV BOLUS: 500.0000 mL | Freq: Once | INTRAVENOUS | Status: DC

## 2021-06-25 MED ORDER — SUCCINYLCHOLINE CHLORIDE 200 MG/10ML IV SOSY
PREFILLED_SYRINGE | INTRAVENOUS | Status: DC | PRN
  Administered 2021-06-25: 100 mg via INTRAVENOUS

## 2021-06-25 MED ORDER — ONDANSETRON HCL 4 MG/2ML IJ SOLN: 4.0000 mg | Freq: Four times a day (QID) | INTRAMUSCULAR | Status: DC | PRN

## 2021-06-25 MED ORDER — HYDROCODONE-ACETAMINOPHEN 5-325 MG PO TABS: 1.0000 | ORAL_TABLET | Freq: Four times a day (QID) | ORAL | 0 refills | Status: DC | PRN

## 2021-06-25 MED ORDER — SODIUM CHLORIDE 0.9 % IV SOLN
1.0000 g | Freq: Once | INTRAVENOUS | Status: AC
  Administered 2021-06-25: 1 g via INTRAVENOUS
  Filled 2021-06-25: qty 10

## 2021-06-25 MED ORDER — BUPIVACAINE-EPINEPHRINE (PF) 0.25% -1:200000 IJ SOLN
INTRAMUSCULAR | Status: DC | PRN
  Administered 2021-06-25: 30 mL

## 2021-06-25 MED ORDER — CHLORHEXIDINE GLUCONATE 0.12 % MT SOLN
15.0000 mL | Freq: Once | OROMUCOSAL | Status: AC
  Administered 2021-06-25: 15 mL via OROMUCOSAL

## 2021-06-25 MED ORDER — SUGAMMADEX SODIUM 200 MG/2ML IV SOLN
INTRAVENOUS | Status: DC | PRN
  Administered 2021-06-25: 154.2 mg via INTRAVENOUS

## 2021-06-25 MED ORDER — ONDANSETRON HCL 4 MG/2ML IJ SOLN: 4.0000 mg | Freq: Once | INTRAMUSCULAR | Status: DC | PRN

## 2021-06-25 MED ORDER — BUPIVACAINE-EPINEPHRINE (PF) 0.25% -1:200000 IJ SOLN
INTRAMUSCULAR | Status: AC
  Filled 2021-06-25: qty 30

## 2021-06-25 MED ORDER — HYDROMORPHONE HCL 1 MG/ML IJ SOLN
0.5000 mg | INTRAMUSCULAR | Status: DC | PRN
  Administered 2021-06-25 (×2): 0.5 mg via INTRAVENOUS
  Filled 2021-06-25 (×2): qty 1

## 2021-06-25 MED ORDER — ROCURONIUM BROMIDE 100 MG/10ML IV SOLN
INTRAVENOUS | Status: DC | PRN
  Administered 2021-06-25: 40 mg via INTRAVENOUS

## 2021-06-25 MED ORDER — ROCURONIUM BROMIDE 10 MG/ML (PF) SYRINGE
PREFILLED_SYRINGE | INTRAVENOUS | Status: AC
  Filled 2021-06-25: qty 10

## 2021-06-25 MED ORDER — DEXAMETHASONE SODIUM PHOSPHATE 10 MG/ML IJ SOLN
INTRAMUSCULAR | Status: DC | PRN
Start: 2021-06-25 — End: 2021-06-25
  Administered 2021-06-25: 10 mg via INTRAVENOUS

## 2021-06-25 MED ORDER — FENTANYL CITRATE (PF) 100 MCG/2ML IJ SOLN
INTRAMUSCULAR | Status: DC | PRN
  Administered 2021-06-25 (×2): 50 ug via INTRAVENOUS

## 2021-06-25 MED ORDER — CHLORHEXIDINE GLUCONATE 0.12 % MT SOLN
OROMUCOSAL | Status: AC
  Filled 2021-06-25: qty 15

## 2021-06-25 MED ORDER — ESCITALOPRAM OXALATE 20 MG PO TABS: 20.0000 mg | ORAL_TABLET | Freq: Every day | ORAL | Status: DC

## 2021-06-25 MED ORDER — ONDANSETRON HCL 4 MG/2ML IJ SOLN
INTRAMUSCULAR | Status: DC | PRN
  Administered 2021-06-25: 4 mg via INTRAVENOUS

## 2021-06-25 MED ORDER — OXYCODONE HCL 5 MG PO TABS
5.0000 mg | ORAL_TABLET | Freq: Once | ORAL | Status: AC
  Administered 2021-06-25: 5 mg via ORAL

## 2021-06-25 MED ORDER — FENTANYL CITRATE PF 50 MCG/ML IJ SOSY: 50.0000 ug | PREFILLED_SYRINGE | Freq: Once | INTRAMUSCULAR | Status: AC

## 2021-06-25 MED ORDER — ALUM & MAG HYDROXIDE-SIMETH 200-200-20 MG/5ML PO SUSP
30.0000 mL | Freq: Once | ORAL | Status: AC
Start: 2021-06-25 — End: 2021-06-25
  Administered 2021-06-25: 30 mL via ORAL
  Filled 2021-06-25: qty 30

## 2021-06-25 MED ORDER — 0.9 % SODIUM CHLORIDE (POUR BTL) OPTIME
TOPICAL | Status: DC | PRN
  Administered 2021-06-25: 500 mL

## 2021-06-25 MED ORDER — LIDOCAINE HCL (CARDIAC) PF 100 MG/5ML IV SOSY
PREFILLED_SYRINGE | INTRAVENOUS | Status: DC | PRN
  Administered 2021-06-25: 80 mg via INTRAVENOUS

## 2021-06-25 MED ORDER — GLYCOPYRROLATE 0.2 MG/ML IJ SOLN
INTRAMUSCULAR | Status: AC
  Filled 2021-06-25: qty 1

## 2021-06-25 MED ORDER — PHENYLEPHRINE HCL-NACL 20-0.9 MG/250ML-% IV SOLN
INTRAVENOUS | Status: DC | PRN
  Administered 2021-06-25: 75 ug/min via INTRAVENOUS

## 2021-06-25 MED ORDER — FENTANYL CITRATE PF 50 MCG/ML IJ SOSY
PREFILLED_SYRINGE | INTRAMUSCULAR | Status: AC
  Administered 2021-06-25: 50 ug via INTRAVENOUS
  Filled 2021-06-25: qty 1

## 2021-06-25 MED ORDER — SUCCINYLCHOLINE CHLORIDE 200 MG/10ML IV SOSY
PREFILLED_SYRINGE | INTRAVENOUS | Status: AC
  Filled 2021-06-25: qty 10

## 2021-06-25 MED ORDER — PHENYLEPHRINE HCL (PRESSORS) 10 MG/ML IV SOLN
INTRAVENOUS | Status: DC | PRN
  Administered 2021-06-25: 240 ug via INTRAVENOUS
  Administered 2021-06-25: 120 ug via INTRAVENOUS
  Administered 2021-06-25: 240 ug via INTRAVENOUS

## 2021-06-25 MED ORDER — BUPROPION HCL ER (SR) 150 MG PO TB12
150.0000 mg | ORAL_TABLET | Freq: Two times a day (BID) | ORAL | Status: DC
  Filled 2021-06-25: qty 1

## 2021-06-25 MED ORDER — IOHEXOL 300 MG/ML  SOLN
100.0000 mL | Freq: Once | INTRAMUSCULAR | Status: AC | PRN
  Administered 2021-06-25: 100 mL via INTRAVENOUS

## 2021-06-25 MED ORDER — LIDOCAINE HCL (PF) 2 % IJ SOLN
INTRAMUSCULAR | Status: AC
  Filled 2021-06-25: qty 5

## 2021-06-25 MED ORDER — PROPOFOL 10 MG/ML IV BOLUS
INTRAVENOUS | Status: AC
  Filled 2021-06-25: qty 20

## 2021-06-25 MED ORDER — KETOROLAC TROMETHAMINE 30 MG/ML IJ SOLN
INTRAMUSCULAR | Status: AC
  Filled 2021-06-25: qty 1

## 2021-06-25 MED ORDER — MIDAZOLAM HCL 2 MG/2ML IJ SOLN
INTRAMUSCULAR | Status: DC | PRN
  Administered 2021-06-25: 2 mg via INTRAVENOUS

## 2021-06-25 MED ORDER — PROPOFOL 10 MG/ML IV BOLUS
INTRAVENOUS | Status: DC | PRN
  Administered 2021-06-25: 150 mg via INTRAVENOUS

## 2021-06-25 MED ORDER — MIDAZOLAM HCL 2 MG/2ML IJ SOLN
INTRAMUSCULAR | Status: AC
  Filled 2021-06-25: qty 2

## 2021-06-25 MED ORDER — PANTOPRAZOLE SODIUM 40 MG IV SOLR
40.0000 mg | Freq: Once | INTRAVENOUS | Status: AC
  Administered 2021-06-25: 40 mg via INTRAVENOUS
  Filled 2021-06-25: qty 10

## 2021-06-25 MED ORDER — FENTANYL CITRATE (PF) 100 MCG/2ML IJ SOLN
INTRAMUSCULAR | Status: AC
  Administered 2021-06-25: 25 ug via INTRAVENOUS
  Filled 2021-06-25: qty 2

## 2021-06-25 MED ORDER — OXYCODONE HCL 5 MG PO TABS
ORAL_TABLET | ORAL | Status: AC
  Filled 2021-06-25: qty 1

## 2021-06-25 SURGICAL SUPPLY — 50 items
ADH SKN CLS APL DERMABOND .7 (GAUZE/BANDAGES/DRESSINGS) ×2
BAG RETRIEVAL 10 (BASKET) ×1
CANNULA CAP OBTURATR AIRSEAL 8 (CAP) ×3 IMPLANT
CLIP LIGATING HEM O LOK PURPLE (MISCELLANEOUS) ×3 IMPLANT
COVER LIGHT HANDLE STERIS (MISCELLANEOUS) ×1 IMPLANT
COVER TIP SHEARS 8 DVNC (MISCELLANEOUS) ×2 IMPLANT
COVER TIP SHEARS 8MM DA VINCI (MISCELLANEOUS) ×1
DERMABOND ADVANCED (GAUZE/BANDAGES/DRESSINGS) ×1
DERMABOND ADVANCED .7 DNX12 (GAUZE/BANDAGES/DRESSINGS) ×2 IMPLANT
DRAPE ARM DVNC X/XI (DISPOSABLE) ×8 IMPLANT
DRAPE COLUMN DVNC XI (DISPOSABLE) ×2 IMPLANT
DRAPE DA VINCI XI ARM (DISPOSABLE) ×4
DRAPE DA VINCI XI COLUMN (DISPOSABLE) ×1
ELECT CAUTERY BLADE 6.4 (BLADE) ×3 IMPLANT
GLOVE SURG ORTHO LTX SZ7.5 (GLOVE) ×6 IMPLANT
GOWN STRL REUS W/ TWL LRG LVL3 (GOWN DISPOSABLE) ×4 IMPLANT
GOWN STRL REUS W/ TWL XL LVL3 (GOWN DISPOSABLE) ×4 IMPLANT
GOWN STRL REUS W/TWL LRG LVL3 (GOWN DISPOSABLE) ×3
GOWN STRL REUS W/TWL XL LVL3 (GOWN DISPOSABLE) ×6
GRASPER SUT TROCAR 14GX15 (MISCELLANEOUS) ×1 IMPLANT
INFUSOR MANOMETER BAG 3000ML (MISCELLANEOUS) IMPLANT
IRRIGATION STRYKERFLOW (MISCELLANEOUS) IMPLANT
IRRIGATOR STRYKERFLOW (MISCELLANEOUS)
IRRIGATOR SUCT 8 DISP DVNC XI (IRRIGATION / IRRIGATOR) IMPLANT
IRRIGATOR SUCTION 8MM XI DISP (IRRIGATION / IRRIGATOR)
IV NS IRRIG 3000ML ARTHROMATIC (IV SOLUTION) IMPLANT
KIT PINK PAD W/HEAD ARE REST (MISCELLANEOUS) ×3 IMPLANT
KIT PINK PAD W/HEAD ARM REST (MISCELLANEOUS) ×2 IMPLANT
KIT TURNOVER KIT A (KITS) ×3 IMPLANT
LABEL OR SOLS (LABEL) ×3 IMPLANT
MANIFOLD NEPTUNE II (INSTRUMENTS) ×3 IMPLANT
NDL INSUFFLATION 14GA 120MM (NEEDLE) IMPLANT
NEEDLE HYPO 22GX1.5 SAFETY (NEEDLE) ×3 IMPLANT
NEEDLE INSUFFLATION 14GA 120MM (NEEDLE) IMPLANT
NS IRRIG 500ML POUR BTL (IV SOLUTION) ×3 IMPLANT
PACK LAP CHOLECYSTECTOMY (MISCELLANEOUS) ×3 IMPLANT
PENCIL ELECTRO HAND CTR (MISCELLANEOUS) ×3 IMPLANT
SEAL CANN UNIV 5-8 DVNC XI (MISCELLANEOUS) ×6 IMPLANT
SEAL XI 5MM-8MM UNIVERSAL (MISCELLANEOUS) ×3
SET TUBE FILTERED XL AIRSEAL (SET/KITS/TRAYS/PACK) ×3 IMPLANT
SOLUTION ELECTROLUBE (MISCELLANEOUS) ×3 IMPLANT
SPIKE FLUID TRANSFER (MISCELLANEOUS) ×3 IMPLANT
SUT MNCRL 4-0 (SUTURE) ×3
SUT MNCRL 4-0 27XMFL (SUTURE) ×2
SUT VICRYL 0 AB UR-6 (SUTURE) ×3 IMPLANT
SUTURE MNCRL 4-0 27XMF (SUTURE) ×2 IMPLANT
SYS BAG RETRIEVAL 10MM (BASKET) ×2
SYSTEM BAG RETRIEVAL 10MM (BASKET) ×2 IMPLANT
TROCAR Z-THREAD FIOS 11X100 BL (TROCAR) ×1 IMPLANT
WATER STERILE IRR 500ML POUR (IV SOLUTION) ×3 IMPLANT

## 2021-06-25 NOTE — Anesthesia Preprocedure Evaluation (Signed)
Anesthesia Evaluation  ?Patient identified by MRN, date of birth, ID band ?Patient awake ? ? ? ?Reviewed: ?Allergy & Precautions, NPO status , Patient's Chart, lab work & pertinent test results ? ?Airway ?Mallampati: II ? ?TM Distance: >3 FB ?Neck ROM: Full ? ? ? Dental ? ?(+) Teeth Intact, Partial Upper ?  ?Pulmonary ?neg pulmonary ROS, Current Smoker,  ?  ?Pulmonary exam normal ?breath sounds clear to auscultation ? ? ? ? ? ? Cardiovascular ?Exercise Tolerance: Good ?negative cardio ROS ?Normal cardiovascular exam ?Rhythm:Regular  ? ?  ?Neuro/Psych ?negative neurological ROS ? negative psych ROS  ? GI/Hepatic ?negative GI ROS, Neg liver ROS,   ?Endo/Other  ?negative endocrine ROS ? Renal/GU ?negative Renal ROS  ?negative genitourinary ?  ?Musculoskeletal ?negative musculoskeletal ROS ?(+)  ? Abdominal ?Normal abdominal exam  (+)   ?Peds ?negative pediatric ROS ?(+)  Hematology ?negative hematology ROS ?(+)   ?Anesthesia Other Findings ?History reviewed. No pertinent past medical history. ? ?Past Surgical History: ?No date: ABDOMINAL HYSTERECTOMY ?No date: TONSILLECTOMY ? ?BMI   ? Body Mass Index: 30.11 kg/m?  ?  ? ? Reproductive/Obstetrics ?negative OB ROS ? ?  ? ? ? ? ? ? ? ? ? ? ? ? ? ?  ?  ? ? ? ? ? ? ? ? ?Anesthesia Physical ?Anesthesia Plan ? ?ASA: 2 ? ?Anesthesia Plan: General  ? ?Post-op Pain Management:   ? ?Induction: Intravenous ? ?PONV Risk Score and Plan: 1 and Ondansetron and Dexamethasone ? ?Airway Management Planned: Oral ETT ? ?Additional Equipment:  ? ?Intra-op Plan:  ? ?Post-operative Plan: Extubation in OR ? ?Informed Consent: I have reviewed the patients History and Physical, chart, labs and discussed the procedure including the risks, benefits and alternatives for the proposed anesthesia with the patient or authorized representative who has indicated his/her understanding and acceptance.  ? ? ? ?Dental Advisory Given ? ?Plan Discussed with: CRNA and  Surgeon ? ?Anesthesia Plan Comments:   ? ? ? ? ? ? ?Anesthesia Quick Evaluation ? ?

## 2021-06-25 NOTE — ED Triage Notes (Signed)
Patient ambulatory to triage with steady gait, without difficulty or distress noted; pt reports awoke this morning with "indigestion" and  upper abd pain accomp by N/V; denies hx of same ?

## 2021-06-25 NOTE — Anesthesia Procedure Notes (Signed)
Procedure Name: Intubation ?Date/Time: 06/25/2021 12:50 PM ?Performed by: Malva Cogan, CRNA ?Pre-anesthesia Checklist: Patient identified, Patient being monitored, Timeout performed, Emergency Drugs available and Suction available ?Patient Re-evaluated:Patient Re-evaluated prior to induction ?Oxygen Delivery Method: Circle system utilized ?Preoxygenation: Pre-oxygenation with 100% oxygen ?Induction Type: IV induction and Rapid sequence ?Laryngoscope Size: 3 and McGraph ?Grade View: Grade I ?Tube type: Oral ?Tube size: 6.5 mm ?Number of attempts: 1 ?Airway Equipment and Method: Stylet and Video-laryngoscopy ?Placement Confirmation: ETT inserted through vocal cords under direct vision, positive ETCO2 and breath sounds checked- equal and bilateral ?Secured at: 19 cm ?Tube secured with: Tape ?Dental Injury: Teeth and Oropharynx as per pre-operative assessment  ? ? ? ? ?

## 2021-06-25 NOTE — ED Notes (Signed)
Report given to same day surgery 

## 2021-06-25 NOTE — ED Notes (Signed)
Pt taken for surgery.

## 2021-06-25 NOTE — Discharge Instructions (Signed)
AMBULATORY SURGERY  ?DISCHARGE INSTRUCTIONS ? ? ?The drugs that you were given will stay in your system until tomorrow so for the next 24 hours you should not: ? ?Drive an automobile ?Make any legal decisions ?Drink any alcoholic beverage ? ? ?You may resume regular meals tomorrow.  Today it is better to start with liquids and gradually work up to solid foods. ? ?You may eat anything you prefer, but it is better to start with liquids, then soup and crackers, and gradually work up to solid foods. ? ? ?Please notify your doctor immediately if you have any unusual bleeding, trouble breathing, redness and pain at the surgery site, drainage, fever, or pain not relieved by medication. ? ? ? ?Additional Instructions: ? ? ? ?Please contact your physician with any problems or Same Day Surgery at [phone], Monday through Friday 6 am to 4 pm, or [place] [facility] [place] Main number at [phone].  ?

## 2021-06-25 NOTE — Transfer of Care (Signed)
Immediate Anesthesia Transfer of Care Note ? ?Patient: Tricia Whitaker ? ?Procedure(s) Performed: XI ROBOTIC ASSISTED LAPAROSCOPIC CHOLECYSTECTOMY (Abdomen) ?INDOCYANINE GREEN FLUORESCENCE IMAGING (ICG) ? ?Patient Location: PACU ? ?Anesthesia Type:General ? ?Level of Consciousness: awake, drowsy and patient cooperative ? ?Airway & Oxygen Therapy: Patient Spontanous Breathing ? ?Post-op Assessment: Report given to RN and Post -op Vital signs reviewed and stable ? ?Post vital signs: Reviewed and stable ? ?Last Vitals:  ?Vitals Value Taken Time  ?BP 114/74 06/25/21 1347  ?Temp 37 ?C 06/25/21 1347  ?Pulse 98 06/25/21 1351  ?Resp 22 06/25/21 1351  ?SpO2 97 % 06/25/21 1351  ?Vitals shown include unvalidated device data. ? ?Last Pain:  ?Vitals:  ? 06/25/21 1347  ?TempSrc:   ?PainSc: Asleep  ?   ? ?  ? ?Complications: No notable events documented. ?

## 2021-06-25 NOTE — ED Provider Notes (Signed)
? ?[facility] ?Provider Note ? ? ? Event Date/Time  ? First MD Initiated Contact with Patient 06/25/21 [phone]   ?  (approximate) ? ? ?History  ? ?Abdominal Pain ? ? ?HPI ? ?Tricia Whitaker is a 46 y.o. female with a history of a hysterectomy who presents for evaluation of abdominal pain.  Pain woke her up from her sleep at 1:30 AM.  Pain is severe, sharp, located in the right upper quadrant/epigastric region, constant and nonradiating.  She has had nausea and several episodes of nonbloody nonbilious emesis.  No diarrhea, no constipation, no fever or chills, no chest pain or shortness of breath.  Pain is 10 out of 10. ?  ? ? ?History reviewed. No pertinent past medical history. ? ?Past Surgical History:  ?Procedure Laterality Date  ? ABDOMINAL HYSTERECTOMY    ? TONSILLECTOMY    ? ? ? ?Physical Exam  ? ?Triage Vital Signs: ?ED Triage Vitals  ?Enc Vitals Group  ?   BP 06/25/21 0633 (!) 148/100  ?   Pulse Rate 06/25/21 0633 80  ?   Resp 06/25/21 0633 18  ?   Temp 06/25/21 0633 97.9 ?F (36.6 ?C)  ?   Temp Source 06/25/21 0633 Oral  ?   SpO2 06/25/21 0633 97 %  ?   Weight 06/25/21 0628 170 lb (77.1 kg)  ?   Height 06/25/21 0628 5\' 3"  (1.6 m)  ?   Head Circumference --   ?   Peak Flow --   ?   Pain Score 06/25/21 0628 10  ?   Pain Loc --   ?   Pain Edu? --   ?   Excl. in GC? --   ? ? ?Most recent vital signs: ?Vitals:  ? 06/25/21 0633  ?BP: (!) 148/100  ?Pulse: 80  ?Resp: 18  ?Temp: 97.9 ?F (36.6 ?C)  ?SpO2: 97%  ? ? ? ?Constitutional: Alert and oriented.  Looks uncomfortable due to pain ?HEENT: ?     Head: Normocephalic and atraumatic.    ?     Eyes: Conjunctivae are normal. Sclera is non-icteric.  ?     Mouth/Throat: Mucous membranes are moist.  ?     Neck: Supple with no signs of meningismus. ?Cardiovascular: Regular rate and rhythm. No murmurs, gallops, or rubs. 2+ symmetrical distal pulses are present in all extremities.  ?Respiratory: Normal respiratory effort. Lungs are clear to auscultation  bilaterally.  ?Gastrointestinal: Soft, tender to palpation in the epigastrium and right upper quadrant with positive Murphy sign, and non distended with positive bowel sounds. No rebound or guarding. ?Genitourinary: No CVA tenderness. ?Musculoskeletal:  No edema, cyanosis, or erythema of extremities. ?Neurologic: Normal speech and language. Face is symmetric. Moving all extremities. No gross focal neurologic deficits are appreciated. ?Skin: Skin is warm, dry and intact. No rash noted. ?Psychiatric: Mood and affect are normal. Speech and behavior are normal. ? ?ED Results / Procedures / Treatments  ? ?Labs ?(all labs ordered are listed, but only abnormal results are displayed) ?Labs Reviewed  ?CBC WITH DIFFERENTIAL/PLATELET  ?COMPREHENSIVE METABOLIC PANEL  ?LIPASE, BLOOD  ? ? ? ?EKG ? ?ED ECG REPORT ?I, 06/27/21, the attending physician, personally viewed and interpreted this ECG. ? ?Normal sinus rhythm with a rate of 84, short PR interval but other intervals are normal, no ST elevations or depressions. ? ?RADIOLOGY ?RUQ Nita Sickle: PND ? ? ?PROCEDURES: ? ?Critical Care performed: No ? ?Procedures ? ? ? ?IMPRESSION / MDM / ASSESSMENT AND  PLAN / ED COURSE  ?I reviewed the triage vital signs and the nursing notes. ? ? 46 y.o. female with a history of a hysterectomy who presents for evaluation of abdominal pain.  Patient looks uncomfortable due to pain.  Her abdomen is nondistended and tender to palpation in the epigastric and right upper quadrant region with positive Murphy sign. ? ?Ddx: Cholecystitis versus cholelithiasis versus pancreatitis versus peptic ulcer disease versus SBO versus volvulus versus kidney stone versus diverticulitis versus appendicitis ? ? ?Plan: Right upper quadrant ultrasound, IV fentanyl and Zofran for symptom relief, CBC, CMP, lipase, EKG, and urinalysis.  Patient placed on telemetry for monitoring of cardiorespiratory status. ? ? ?MEDICATIONS GIVEN IN ED: ?Medications  ?fentaNYL (SUBLIMAZE)  injection 50 mcg (has no administration in time range)  ?ondansetron (ZOFRAN) injection 4 mg (has no administration in time range)  ? ? ? ?ED COURSE: Labs and imaging pending.  Care transferred to Dr. Roxan Hockey at 7 AM ? ? ?Consults: None ? ? ?EMR reviewed including records from her last routine general examination in 2019 by her primary care doctor ? ? ? ?FINAL CLINICAL IMPRESSION(S) / ED DIAGNOSES  ? ?Final diagnoses:  ?RUQ abdominal pain  ? ? ? ?Rx / DC Orders  ? ?ED Discharge Orders   ? ? None  ? ?  ? ? ? ?Note:  This document was prepared using Dragon voice recognition software and may include unintentional dictation errors. ? ? ?Please note:  Patient was evaluated in Emergency Department today for the symptoms described in the history of present illness. Patient was evaluated in the context of the global COVID-19 pandemic, which necessitated consideration that the patient might be at risk for infection with the SARS-CoV-2 virus that causes COVID-19. Institutional protocols and algorithms that pertain to the evaluation of patients at risk for COVID-19 are in a state of rapid change based on information released by regulatory bodies including the CDC and federal and state organizations. These policies and algorithms were followed during the patient's care in the ED.  Some ED evaluations and interventions may be delayed as a result of limited staffing during the pandemic. ? ? ? ? ?  ?Nita Sickle, MD ?06/25/21 [phone] ? ?

## 2021-06-25 NOTE — Discharge Summary (Signed)
Physician Discharge Summary  ?Patient ID: ?Tricia Whitaker ?MRN: [id number] ?DOB/AGE: [birth year] 46 y.o. ? ?Admit date: 06/25/2021 ?Discharge date: 06/25/2021 ? ?Admission Diagnoses: ? ?Discharge Diagnoses:  ?Principal Problem: ?  Acute cholecystitis due to biliary calculus ? ? ?Discharged Condition: good ? ?Hospital Course: Underwent robotic cholecystectomy for acute calculus cholecystitis/hydrops. ? ?Consults: None ? ?Significant Diagnostic Studies: radiology: Ultrasound: And CT scan consistent with acute cholecystitis. ? ?Treatments: surgery: When noted that the acute nature is due to hydrops, it was felt expectant that patient would do well and proceed with discharge home without requiring any additional antibiotics. ? ?Discharge Exam: ?Blood pressure 99/62, pulse 77, temperature (!) 97.2 ?F (36.2 ?C), temperature source Temporal, resp. rate 14, height 5\' 3"  (1.6 m), weight 77.1 kg, SpO2 98 %. ?Incision/Wound: All incisions are intact with Dermabond intact on discharge. ? ?Disposition: Discharge disposition: 01-Home or Self Care ? ? ? ? ? ? ?Discharge Instructions   ? ? Call MD for:  persistant nausea and vomiting   Complete by: As directed ?  ? Call MD for:  redness, tenderness, or signs of infection (pain, swelling, redness, odor or green/yellow discharge around incision site)   Complete by: As directed ?  ? Call MD for:  severe uncontrolled pain   Complete by: As directed ?  ? Diet - low sodium heart healthy   Complete by: As directed ?  ? Discharge wound care:   Complete by: As directed ?  ? Your incision was closed with Dermabond.  It is best to keep it clean and dry, it will tolerate a brief shower, but do not soak it or apply any creams or lotions to the incisions.  The Dermabond should gradually flake off over time.  Keep it open to air so you can evaluate your incisions.  Dermabond assists the underlying sutures to keep your incision closed and protected from infection.  Should you develop some drainage from  your incision, some drops of drainage would be okay but if it persists continue to put keep a dry dressing over it.  ? Driving Restrictions   Complete by: As directed ?  ? No driving until cleared after follow-up appointment.  Is not advised to drive while taking narcotic pain medications or in significant pain.  ? Increase activity slowly   Complete by: As directed ?  ? Lifting restrictions   Complete by: As directed ?  ? Strongly advised against any form of lifting greater than 15 pounds over the next 4 to 6 weeks.  This involves pushing/pulling movements as well.  After 4 weeks when may gradually engage in more activities remaining aware of any new pain/tenderness elicited, and avoiding those for the full duration of 6 weeks.  Walking is encouraged.  Climbing stairs with caution.  ? ?  ? ?Allergies as of 06/25/2021   ? ?   Reactions  ? Nicoderm [nicotine]   ? ?  ? ?  ?Medication List  ?  ? ?TAKE these medications   ? ?buPROPion 150 MG 12 hr tablet ?Commonly known as: WELLBUTRIN SR ?Take 150 mg by mouth 2 (two) times daily. ?  ?clonazePAM 0.5 MG tablet ?Commonly known as: KLONOPIN ?TAKE 1 TABLET BY MOUTH 2 TIMES A DAY AS NEEDED ?  ?diclofenac sodium 1 % Gel ?Commonly known as: VOLTAREN ?Apply 2 g topically 4 (four) times daily. Rub into affected area of foot 2 to 4 times daily ?  ?escitalopram 20 MG tablet ?Commonly known as: LEXAPRO ?Take by mouth. ?  ?  HYDROcodone-acetaminophen 5-325 MG tablet ?Commonly known as: NORCO/VICODIN ?Take 1 tablet by mouth every 6 (six) hours as needed for moderate pain. ?  ?meloxicam 15 MG tablet ?Commonly known as: MOBIC ?Take 1 tablet (15 mg total) by mouth daily. ?  ?methylPREDNISolone 4 MG Tbpk tablet ?Commonly known as: MEDROL DOSEPAK ?Take as directed ?  ?phentermine 37.5 MG tablet ?Commonly known as: ADIPEX-P ?Take 37.5 mg by mouth every morning. ?  ?Vitamin D (Ergocalciferol) 1.25 MG (50000 UNIT) Caps capsule ?Commonly known as: DRISDOL ?  ? ?  ? ?  ?  ? ? ?  ?Discharge Care  Instructions  ?(From admission, onward)  ?  ? ? ?  ? ?  Start     Ordered  ? 06/25/21 0000  Discharge wound care:       ?Comments: Your incision was closed with Dermabond.  It is best to keep it clean and dry, it will tolerate a brief shower, but do not soak it or apply any creams or lotions to the incisions.  The Dermabond should gradually flake off over time.  Keep it open to air so you can evaluate your incisions.  Dermabond assists the underlying sutures to keep your incision closed and protected from infection.  Should you develop some drainage from your incision, some drops of drainage would be okay but if it persists continue to put keep a dry dressing over it.  ? 06/25/21 1347  ? ?  ?  ? ?  ? ? Follow-up Information   ? ? Donovan Kail, PA-C. Go on 07/09/2021.   ?Specialty: Physician Assistant ?Why: at 3:00 pm, Post operative follow up ?Contact information: ?[address] ?[address] ?[place] [address] ?[phone] ? ? ?  ?  ? ?  ?  ? ?  ? ? ?Signed: ?Campbell Lerner, M.D., FACS ?[facility] ?06/25/2021, 9:23 PM ?  ?

## 2021-06-25 NOTE — H&P (Signed)
Patient ID: Tricia Whitaker, female   DOB: 03/28/1976, 46 y.o.   MRN: [id number] ? ?Chief Complaint: Abdominal pain ? ?History of Present Illness ?Tricia Whitaker is a 46 y.o. female with right upper quadrant/epigastric pain which radiates to the back.  Began acutely Tuesday morning, progressed somewhat that day only to recur severely yesterday.  She does in retrospect think that fatty foods may have been the culprit.  No prior history of similar pain.  Current pain is unrelenting, with IV pain medication lasting only minutes to an hour at most.  She denies any history of jaundice or hepatitis.  Associated nausea with vomiting.  No history of hematemesis, melena or hematochezia.Marland Kitchen ? ?Past Medical History ?History reviewed. No pertinent past medical history.  ? ? ?Past Surgical History:  ?Procedure Laterality Date  ? ABDOMINAL HYSTERECTOMY    ? TONSILLECTOMY    ? ? ?Allergies  ?Allergen Reactions  ? Nicoderm [Nicotine]   ? ? ?Current Facility-Administered Medications  ?Medication Dose Route Frequency Provider Last Rate Last Admin  ? cefTRIAXone (ROCEPHIN) 1 g in sodium chloride 0.9 % 100 mL IVPB  1 g Intravenous Once Ronny Bacon, MD      ? HYDROmorphone (DILAUDID) injection 0.5 mg  0.5 mg Intravenous Q2H PRN Ronny Bacon, MD   0.5 mg at 06/25/21 [id number]  ? lactated ringers bolus 500 mL  500 mL Intravenous Once Ronny Bacon, MD      ? lactated ringers infusion   Intravenous Continuous Ronny Bacon, MD      ? metroNIDAZOLE (FLAGYL) IVPB 500 mg  500 mg Intravenous Once Ronny Bacon, MD      ? ?Current Outpatient Medications  ?Medication Sig Dispense Refill  ? buPROPion (WELLBUTRIN SR) 150 MG 12 hr tablet Take 150 mg by mouth 2 (two) times daily.    ? clonazePAM (KLONOPIN) 0.5 MG tablet TAKE 1 TABLET BY MOUTH 2 TIMES A DAY AS NEEDED (Patient not taking: Reported on 06/25/2021)  0  ? diclofenac sodium (VOLTAREN) 1 % GEL Apply 2 g topically 4 (four) times daily. Rub into affected area of foot 2 to 4 times daily  (Patient not taking: Reported on 06/25/2021) 100 g 2  ? escitalopram (LEXAPRO) 20 MG tablet Take by mouth.    ? meloxicam (MOBIC) 15 MG tablet Take 1 tablet (15 mg total) by mouth daily. (Patient not taking: Reported on 06/25/2021) 30 tablet 2  ? methylPREDNISolone (MEDROL DOSEPAK) 4 MG TBPK tablet Take as directed (Patient not taking: Reported on 06/25/2021) 21 tablet 0  ? phentermine (ADIPEX-P) 37.5 MG tablet Take 37.5 mg by mouth every morning. (Patient not taking: Reported on 06/25/2021)  0  ? Vitamin D, Ergocalciferol, (DRISDOL) 50000 units CAPS capsule  (Patient not taking: Reported on 06/25/2021)    ? ? ?Family History ?Family History  ?Problem Relation Age of Onset  ? Breast cancer Mother 70  ? Breast cancer Paternal Grandmother   ?  ? ? ?Social History ?Social History  ? ?Tobacco Use  ? Smoking status: Never  ? Smokeless tobacco: Never  ?Vaping Use  ? Vaping Use: Never used  ?Substance Use Topics  ? Alcohol use: Yes  ?  Alcohol/week: 0.0 standard drinks  ?  Comment: not often  ?  ?  ? ? ?Review of Systems  ?Constitutional:  Negative for chills and fever.  ?HENT: Negative.    ?Eyes: Negative.   ?Respiratory: Negative.    ?Cardiovascular: Negative.   ?Gastrointestinal:  Positive for abdominal pain, nausea and vomiting. Negative for  blood in stool and melena.  ?Genitourinary: Negative.   ?Skin: Negative.   ?Neurological: Negative.   ?Psychiatric/Behavioral: Negative.    ?  ? ?Physical Exam ?Blood pressure (!) 110/58, pulse 90, temperature 97.9 ?F (36.6 ?C), temperature source Oral, resp. rate 20, height 5\' 3"  (1.6 m), weight 77.1 kg, SpO2 98 %. ?Last Weight  Most recent update: 06/25/2021  6:28 AM  ? ? Weight  ?77.1 kg (170 lb)  ?      ? ?  ? ? ?CONSTITUTIONAL: Well developed, and nourished, appropriately responsive and aware with mild apparent distress.   ?EYES: Sclera non-icteric.   ?EARS, NOSE, MOUTH AND THROAT:  The oropharynx is clear. Oral mucosa is pink and moist.    Hearing is intact to voice.  ?NECK:  Trachea is midline, and there is no jugular venous distension.  ?LYMPH NODES:  Lymph nodes in the neck are not enlarged. ?RESPIRATORY:  Lungs are clear, and breath sounds are equal bilaterally. Normal respiratory effort without pathologic use of accessory muscles. ?CARDIOVASCULAR: Heart is regular in rate and rhythm. ?GI: The abdomen is tender with guarding to the right upper quadrant, otherwise soft, nontender, and nondistended. There were no palpable masses. I did not appreciate hepatosplenomegaly.  ?MUSCULOSKELETAL:  Symmetrical muscle tone appreciated in all four extremities.    ?SKIN: Skin turgor is normal. No pathologic skin lesions appreciated.  ?NEUROLOGIC:  Motor and sensation appear grossly normal.  Cranial nerves are grossly without defect. ?PSYCH:  Alert and oriented to person, place and time. Affect is appropriate for situation. ? ?Data Reviewed ?I have personally reviewed what is currently available of the patient's imaging, recent labs and medical records.   ?Labs:  ?CBC Latest Ref Rng & Units 06/25/2021 12/13/2015  ?WBC 4.0 - 10.5 K/uL 12.2(H) 8.1  ?Hemoglobin 12.0 - 15.0 g/dL 15.2(H) 16.0  ?Hematocrit 36.0 - 46.0 % 45.5 45.1  ?Platelets 150 - 400 K/uL 318 262  ? ?CMP Latest Ref Rng & Units 06/25/2021 12/13/2015  ?Glucose 70 - 99 mg/dL 137(H) 99  ?BUN 6 - 20 mg/dL 22(H) 13  ?Creatinine 0.44 - 1.00 mg/dL 0.86 0.65  ?Sodium 135 - 145 mmol/L 137 136  ?Potassium 3.5 - 5.1 mmol/L 3.9 3.5  ?Chloride 98 - 111 mmol/L 99 104  ?CO2 22 - 32 mmol/L 27 24  ?Calcium 8.9 - 10.3 mg/dL 9.6 8.7(L)  ?Total Protein 6.5 - 8.1 g/dL 7.8 7.4  ?Total Bilirubin 0.3 - 1.2 mg/dL 0.5 0.6  ?Alkaline Phos 38 - 126 U/L 42 37(L)  ?AST 15 - 41 U/L 22 24  ?ALT 0 - 44 U/L 18 24  ? ? ? ? ?Imaging: ? ?Within last 24 hrs: US ABDOMEN LIMITED RUQ (LIVER/GB) ? ?Result Date: 06/25/2021 ?CLINICAL DATA:  Right upper quadrant pain for 2 days. EXAM: ULTRASOUND ABDOMEN LIMITED RIGHT UPPER QUADRANT COMPARISON:  CT abdomen and pelvis 12/13/2015 FINDINGS:  Gallbladder: There is an echogenic shadowing gallstone measuring up to 2.2 cm. No gallbladder wall thickening pericholecystic fluid or sonographic Murphy's sign. Common bile duct: Diameter: 3 mm Liver: Smooth liver contours. Moderately increased echogenicity and attenuation of the liver parenchyma suggesting fatty infiltration. No focal liver lesion is seen. Portal vein is patent on color Doppler imaging with normal direction of blood flow towards the liver. Other: None. IMPRESSION:: IMPRESSION: 1. A single gallstone measuring up to 2.2 cm is seen. No sonographic evidence of acute cholecystitis. 2. Likely fatty infiltration of the liver. Electronically Signed   By: Yvonne Kendall M.D.   On:  06/25/2021 08:11   ? ?Assessment ?   ?Acute calculus cholecystitis ?Patient Active Problem List  ? Diagnosis Date Noted  ? 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 ? ?Plan ?   ?Robotic cholecystectomy with ICG imaging. ? ?This was discussed thoroughly.  Optimal plan is for robotic cholecystectomy.  Risks and benefits have been discussed with the patient which include but are not limited to anesthesia, bleeding, infection, biliary ductal injury or stenosis, other associated unanticipated injuries affiliated with laparoscopic surgery.  I believe there is the desire to proceed, interpreter utilized as needed.  Questions elicited and answered to satisfaction.  No guarantees ever expressed or implied. ? ? ?Face-to-face time spent with the patient and accompanying care providers(if present) was 30 minutes, with more than 50% of the time spent counseling, educating, and coordinating care of the patient.   ? ?These notes generated with voice recognition software. I apologize for typographical errors. ? ?Ronny Bacon M.D., FACS ?06/25/2021, 9:37 AM ? ? ? ? ?

## 2021-06-25 NOTE — ED Provider Notes (Signed)
Patient received in signout from Dr. Don Perking pending right upper quadrant ultrasound and blood work.  Does have mild leukocytosis.  Her right upper quadrant ultrasound does show cholelithiasis but no pericholecystic fluid or gallbladder wall thickening.  Reexam she does have significant right upper quadrant pain and epigastric pain.  Possible gastritis but given the level of discomfort CT imaging will be ordered.  Will give IV Dilaudid. ? ?CT imaging does show evidence of probable acute cholecystitis with pericholecystic edema stone bed in the neck of gallbladder.  We will give IV antibiotics.  I consulted with Dr. Claudine Mouton of general surgery who will admit patient to their service. ?  ?Willy Eddy, MD ?06/25/21 [phone] ? ?

## 2021-06-25 NOTE — Op Note (Signed)
Robotic cholecystectomy with Indocyamine Green Ductal Imaging.  ? ?Pre-operative Diagnosis: Acute calculus cholecystitis ? ?Post-operative Diagnosis:  Same, hydrops. ? ?Procedure: Robotic assisted laparoscopic cholecystectomy with Indocyamine Green Ductal Imaging.  ? ?Surgeon: Campbell Lerner, M.D., FACS ? ?Anesthesia: General. with endotracheal tube ? ?Findings: Hydrops appearing bile from gallbladder, marked edema. ? ?Estimated Blood Loss: 15 mL ?        ?Drains: None ?        ?Specimens: Gallbladder     ?      ?Complications: none ? ?Procedure Details  ?The patient was seen again in the Holding Room.  2.5 mg dose of ICG was administered intravenously.   ?The benefits, complications, treatment options, risks and expected outcomes were again reviewed with the patient. The likelihood of improving the patient's symptoms with return to their baseline status is good.  The patient and/or family concurred with the proposed plan, giving informed consent, again alternatives reviewed.  The patient was taken to Operating Room, identified, and the procedure verified as robotic assisted laparoscopic cholecystectomy. ? ?Prior to the induction of general anesthesia, antibiotic prophylaxis was administered. VTE prophylaxis was in place. General endotracheal anesthesia was then administered and tolerated well. The patient was positioned in the supine position.  After the induction, the abdomen was prepped with Chloraprep and draped in the sterile fashion.  ?A Time Out was held and the above information confirmed. ? ?Right infra-umbilical local infiltration with quarter percent Marcaine with epinephrine is utilized.  Made a 12 mm incision on the right periumbilical site, I advanced an optical 24mm port under direct visualization into the peritoneal cavity.  Once the peritoneum was penetrated, insufflation was initiated.  The trocar was then advanced into the abdominal cavity under direct visualization. ?Pneumoperitoneum was then  continued with Air seal utilizing CO2 at 15 mmHg or less and tolerated well without any adverse changes in the patient's vital signs.  Two 8.5-mm ports were placed in the left lower quadrant and laterally, and one to the right lower quadrant, all under direct vision. All skin incisions  were infiltrated with a local anesthetic agent before making the incision and placing the trocars.  ?The patient was positioned  in reverse Trendelenburg, tilted the patient's left side down.  Da Vinci XI robot was then positioned on to the patient's left side, and docked. ? ?The gallbladder was identified, the fundus grasped via the arm 4 Prograsp and retracted cephalad. Adhesions were lysed with scissors and cautery.  The infundibulum was identified grasped and retracted laterally, exposing the peritoneum overlying the triangle of Calot. This was then opened and dissected using cautery & scissors. An extended critical view of the cystic duct and cystic artery was obtained, aided by the ICG via FireFly which enabled ready visualization of the ductal anatomy.   ? ?The cystic duct was clearly identified and dissected to isolation.   Artery well isolated and clipped, and the cystic duct was triple clipped and divided with scissors, as close to the gallbladder neck as feasible, thus leaving two on the remaining stump.  The specimen side of the artery is sealed with bipolar and divided with monopolar scissors.  ? ?The gallbladder was taken from the gallbladder fossa in a retrograde fashion with the electrocautery. The gallbladder was removed and placed in an Endocatch bag.  ?The liver bed is inspected. Hemostasis was confirmed.  ?The robot was undocked and moved away from the operative field. ?No irrigation was utilized.  Minimal drainage from the gallbladder and edema fluid  was aspirated clear.  The gallbladder and Endocatch sac were then removed through the infraumbilical port site.  ? ?Inspection of the right upper quadrant was  performed. No bleeding, bile duct injury or leak, or bowel injury was noted. The infra-umbilical port site fascia was closed with interrumpted 0 Vicryl sutures using PMI/cone under direct visualization. Pneumoperitoneum was released and ports removed.  4-0 subcuticular Monocryl was used to close the skin. Dermabond was  applied.  The patient was then extubated and brought to the recovery room in stable condition. Sponge, lap, and needle counts were correct at closure and at the conclusion of the case.  ?        ?     ?Campbell Lerner, M.D., FACS ?06/25/2021 1:46 PM  ?

## 2021-06-26 LAB — SURGICAL PATHOLOGY

## 2021-06-26 NOTE — Anesthesia Postprocedure Evaluation (Signed)
Anesthesia Post Note ? ?Patient: Tricia Whitaker ? ?Procedure(s) Performed: XI ROBOTIC ASSISTED LAPAROSCOPIC CHOLECYSTECTOMY (Abdomen) ?INDOCYANINE GREEN FLUORESCENCE IMAGING (ICG) ? ?Patient location during evaluation: PACU ?Anesthesia Type: General ?Level of consciousness: awake and alert ?Pain management: pain level controlled ?Vital Signs Assessment: post-procedure vital signs reviewed and stable ?Respiratory status: spontaneous breathing, nonlabored ventilation and respiratory function stable ?Cardiovascular status: blood pressure returned to baseline and stable ?Postop Assessment: no apparent nausea or vomiting ?Anesthetic complications: no ? ? ?No notable events documented. ? ? ?Last Vitals:  ?Vitals:  ? 06/25/21 1500 06/25/21 1513  ?BP: 101/69 99/62  ?Pulse: 86 77  ?Resp: 20 14  ?Temp: 36.9 ?C (!) 36.2 ?C  ?SpO2: 95% 98%  ?  ?Last Pain:  ?Vitals:  ? 06/26/21 0939  ?TempSrc:   ?PainSc: 6   ? ? ?  ?  ?  ?  ?  ?  ? ?Foye Deer ? ? ? ? ?

## 2021-07-09 ENCOUNTER — Encounter: Payer: Self-pay | Admitting: Physician Assistant

## 2021-07-09 ENCOUNTER — Ambulatory Visit (INDEPENDENT_AMBULATORY_CARE_PROVIDER_SITE_OTHER): Payer: Managed Care, Other (non HMO) | Admitting: Physician Assistant

## 2021-07-09 VITALS — BP 108/70 | HR 79 | Temp 98.3°F | Ht 63.0 in | Wt 175.0 lb

## 2021-07-09 DIAGNOSIS — K8000 Calculus of gallbladder with acute cholecystitis without obstruction: Secondary | ICD-10-CM

## 2021-07-09 DIAGNOSIS — Z09 Encounter for follow-up examination after completed treatment for conditions other than malignant neoplasm: Secondary | ICD-10-CM

## 2021-07-09 NOTE — Patient Instructions (Signed)

## 2021-07-09 NOTE — Progress Notes (Signed)
[facility] ?POST-OP OFFICE VISIT ? ?07/09/2021 ? ?HPI: ?Tricia Whitaker is a 46 y.o. female 14 days s/p robotic assisted laparoscopic cholecystectomy for acute cholecystitis with Dr Claudine Mouton  ? ?She is doing very well ?No abdominal pain, nausea, emesis, or bowel changes ?Incisions are healing well ?No issues with PO intake ?No other complaints  ? ?Vital signs: ?BP 108/70   Pulse 79   Temp 98.3 ?F (36.8 ?C)   Ht 5\' 3"  (1.6 m)   Wt 175 lb (79.4 kg)   SpO2 97%   BMI 31.00 kg/m?   ? ?Physical Exam: ?Constitutional: Well appearing female, NAD ?Abdomen: Soft, non-tender, non-distended, no rebound/guarding ?Skin: Laparoscopic incisions are healing well, no erythema or drainage  ? ?Assessment/Plan: ?This is a 46 y.o. female 14 days s/p robotic assisted laparoscopic cholecystectomy for acute cholecystitis  ? ? - Pain control prn ? - Reviewed wound care recommendation ? - Reviewed lifting restrictions; 4 weeks total ? - Reviewed surgical pathology; CCC ? - She can follow up on as needed basis; She understands to call with questions/concerns ? ?-- ?57, PA-C ?[facility] ?07/09/2021, 3:12 PM ?[phone] ?M-F: 7am - 4pm ? ?

## 2023-07-14 ENCOUNTER — Ambulatory Visit: Admitting: Orthopaedic Surgery

## 2023-08-08 ENCOUNTER — Telehealth: Payer: Self-pay | Admitting: Orthopaedic Surgery

## 2023-08-08 NOTE — Telephone Encounter (Signed)
 Patient called and said that the steroids aren't working at all. She stated that she is humped over its hurting so bad. CB# [phone]

## 2024-02-13 ENCOUNTER — Encounter: Payer: Self-pay | Admitting: Radiology
